# Patient Record
Sex: Female | Born: 1960 | Race: White | Hispanic: No | Marital: Single | Smoking: Current every day smoker
Health system: Southern US, Community
[De-identification: ages and names within clinical notes are randomized; demographics above are authoritative.]

## PROBLEM LIST (undated history)

## (undated) DIAGNOSIS — M1990 Unspecified osteoarthritis, unspecified site: Secondary | ICD-10-CM

## (undated) HISTORY — DX: Unspecified osteoarthritis, unspecified site: M19.90

## (undated) HISTORY — PX: OTHER SURGICAL HISTORY: SHX169

---
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
## 1984-05-16 HISTORY — PX: ABDOMINAL HYSTERECTOMY: SHX81

## 2013-09-13 DIAGNOSIS — Z90710 Acquired absence of both cervix and uterus: Secondary | ICD-10-CM | POA: Insufficient documentation

## 2015-12-31 DIAGNOSIS — M5136 Other intervertebral disc degeneration, lumbar region: Secondary | ICD-10-CM | POA: Diagnosis not present

## 2016-01-12 DIAGNOSIS — F17200 Nicotine dependence, unspecified, uncomplicated: Secondary | ICD-10-CM | POA: Diagnosis not present

## 2016-01-12 DIAGNOSIS — Z0000 Encounter for general adult medical examination without abnormal findings: Secondary | ICD-10-CM | POA: Diagnosis not present

## 2016-01-12 DIAGNOSIS — F17210 Nicotine dependence, cigarettes, uncomplicated: Secondary | ICD-10-CM | POA: Diagnosis not present

## 2016-01-12 DIAGNOSIS — G4700 Insomnia, unspecified: Secondary | ICD-10-CM | POA: Diagnosis not present

## 2017-02-21 NOTE — Progress Notes (Signed)
Sara [name] is a 56 y.o. female presents to office today to establish care and for annual physical exam examination.    Concerns today include: 1. Bumps in her chest Patient reports she noticed bumps on her chest about 2 months ago. She denies breast lumps or masses. No nipple discharge, no changes in the breast skin texture.    Occupation: Patent examiner part time at [employer], Substance use: tobacco use Diet: Balanced, Exercise: Physically active at work Last eye exam: 2018 Last dental exam: Greater than 6 months Last colonoscopy: Never. Patient declines cancer screening of all kinds. Last mammogram: Never. Patient declines breast cancer screening of all kinds. Last pap smear: History of total hysterectomy in the 80s.   Past Medical History:  Diagnosis Date  . Arthritis    Social History   Social History  . Marital status: Single    Spouse name: N/A  . Number of children: N/A  . Years of education: N/A   Occupational History  . Not on file.   Social History Main Topics  . Smoking status: Current Every Day Smoker    Packs/day: 1.00    Years: 15.00    Types: Cigarettes  . Smokeless tobacco: Never Used  . Alcohol use No  . Drug use: No  . Sexual activity: Yes    Birth control/ protection: None     Comment: monogomous relationship   Other Topics Concern  . Not on file   Social History Narrative  . No narrative on file   Past Surgical History:  Procedure Laterality Date  . ABDOMINAL HYSTERECTOMY  1986  . FRACTURE SURGERY  1979   fx collar bone   Family History  Problem Relation Age of Onset  . COPD Mother   . Heart disease Mother   . Dementia Mother   . Kidney disease Father   . Alzheimer's disease Father   . Prostate cancer Father   . Cancer Brother   . Colon cancer Brother 72  . Aneurysm Maternal Grandmother   . Dementia Maternal Grandfather   . Alzheimer's disease Paternal Grandmother    No current outpatient prescriptions on  file.   ROS: Review of Systems Constitutional: negative Eyes: positive for contacts/glasses Ears, nose, mouth, throat, and face: positive for nasal congestion Respiratory: positive for intermittent cough Cardiovascular: negative Gastrointestinal: negative Genitourinary:negative Integument/breast: bumps as above Hematologic/lymphatic: negative Musculoskeletal:negative Neurological: negative Behavioral/Psych: positive for some sadness regarding the loss of her mother. Endocrine: negative Allergic/Immunologic: allergies to dust    Physical exam BP (!) 125/59   Pulse 73   Temp (!) 97.3 F (36.3 C)   Wt 126 lb (57.2 kg)  General appearance: alert, cooperative, appears stated age and no distress Head: Normocephalic, without obvious abnormality, atraumatic Eyes: negative findings: lids and lashes normal, conjunctivae and sclerae normal, corneas clear and pupils equal, round, reactive to light and accomodation Ears: external ears normal bilaterally Nose: Nares normal. Septum midline. Mucosa normal. No drainage or sinus tenderness. Throat: lips, mucosa, and tongue normal; teeth and gums normal Neck: no adenopathy, no carotid bruit, no JVD, supple, symmetrical, trachea midline and thyroid not enlarged, symmetric, no tenderness/mass/nodules Back: symmetric, no curvature. ROM normal. No CVA tenderness. Lungs: Slightly prolonged expiratory phase. Otherwise clear to auscultation bilaterally. Normal work of breathing on room air. Heart: regular rate and rhythm, S1, S2 normal, no murmur, click, rub or gallop Abdomen: soft, non-tender; bowel sounds normal; no masses,  no organomegaly Extremities: extremities normal, atraumatic, no cyanosis or edema Pulses:  2+ and symmetric Skin: Skin color, texture, turgor normal. No rashes or lesions or Areas of lumps on chest the patient was concerned about her rib protrusions. No discrete masses palpated. Lymph nodes: Cervical, supraclavicular, and axillary  nodes normal. Neurologic: Grossly normal    Assessment/ Plan: Sara Larry. Coco here for annual physical exam. She is a pleasant 56 year old woman who is here to establish care. During today's evaluation, she did express much reluctance and distrust of medical field secondary to her experience with her mother, who passed away in 06-25-2015. She does not wish to proceed with any cancer screenings, including mammography and colon cancer screening. We did discuss various options of her noninvasive for colon cancer screening. She has capacity and does understand the repercussions of a possible undiagnosed cancer, including death and disability. She accepts these risks. She would however like to have blood labs obtained.  Of note, towards the end of our visit patient was much more relaxed and engaging.  1. Annual physical exam Patient reports a urinalysis for evaluation of pH. No urinary complaints today. - Urinalysis  2. Tobacco use disorder Action phase of smoking cessation.  Offered resources but patient wishes to wean independently.  3. History of hysterectomy Pap not needed.  4. Chronic fatigue Declines all cancer screenings.  Will obtain metabolic labs. - CMP14+EGFR - TSH - CBC  5. Screening for HIV without presence of risk factors Low risk.  No known exposure.  No h/o IV drug use. - HIV antibody (with reflex)  6. Encounter for hepatitis C screening test for low risk patient Low risk.  No known exposure.  No h/o IV drug use. - Hepatitis C antibody  7. Screening for lipid disorders - Lipid Panel  8. Encounter for vitamin deficiency screening - VITAMIN D 25 Hydroxy (Vit-D Deficiency, Fractures)  9. Non-seasonal allergic rhinitis due to other allergic trigger Recommended sinus rinses. Patient wishes to pursue holistic measures if possible. We did discuss consideration for Allegra or Claritin as these are nonsedating if sinus rinses are insufficient. Primary allergen is  dust.   Counseled on healthy lifestyle choices, including diet (rich in fruits, vegetables and lean meats and low in salt and simple carbohydrates) and exercise (at least 30 minutes of moderate physical activity daily).  Patient to follow up in 1 year for annual exam or sooner if needed.  Ashly M. Lajuana Ripple, DO

## 2017-02-22 ENCOUNTER — Encounter (INDEPENDENT_AMBULATORY_CARE_PROVIDER_SITE_OTHER): Payer: Self-pay

## 2017-02-22 ENCOUNTER — Ambulatory Visit (INDEPENDENT_AMBULATORY_CARE_PROVIDER_SITE_OTHER): Payer: BLUE CROSS/BLUE SHIELD | Admitting: Family Medicine

## 2017-02-22 ENCOUNTER — Encounter: Payer: Self-pay | Admitting: Family Medicine

## 2017-02-22 VITALS — BP 125/59 | HR 73 | Temp 97.3°F | Wt 126.0 lb

## 2017-02-22 DIAGNOSIS — Z0000 Encounter for general adult medical examination without abnormal findings: Secondary | ICD-10-CM | POA: Diagnosis not present

## 2017-02-22 DIAGNOSIS — Z90710 Acquired absence of both cervix and uterus: Secondary | ICD-10-CM

## 2017-02-22 DIAGNOSIS — R5382 Chronic fatigue, unspecified: Secondary | ICD-10-CM

## 2017-02-22 DIAGNOSIS — Z1321 Encounter for screening for nutritional disorder: Secondary | ICD-10-CM

## 2017-02-22 DIAGNOSIS — Z114 Encounter for screening for human immunodeficiency virus [HIV]: Secondary | ICD-10-CM | POA: Diagnosis not present

## 2017-02-22 DIAGNOSIS — J309 Allergic rhinitis, unspecified: Secondary | ICD-10-CM | POA: Insufficient documentation

## 2017-02-22 DIAGNOSIS — J3089 Other allergic rhinitis: Secondary | ICD-10-CM

## 2017-02-22 DIAGNOSIS — Z13220 Encounter for screening for lipoid disorders: Secondary | ICD-10-CM

## 2017-02-22 DIAGNOSIS — F17200 Nicotine dependence, unspecified, uncomplicated: Secondary | ICD-10-CM

## 2017-02-22 DIAGNOSIS — Z1159 Encounter for screening for other viral diseases: Secondary | ICD-10-CM

## 2017-02-22 LAB — URINALYSIS
Bilirubin, UA: NEGATIVE
Glucose, UA: NEGATIVE
Ketones, UA: NEGATIVE
Leukocytes, UA: NEGATIVE
Nitrite, UA: NEGATIVE
Protein, UA: NEGATIVE
RBC, UA: NEGATIVE
Specific Gravity, UA: 1.02 (ref 1.005–1.030)
Urobilinogen, Ur: 0.2 mg/dL (ref 0.2–1.0)
pH, UA: 7 (ref 5.0–7.5)

## 2017-02-22 NOTE — Patient Instructions (Signed)
It was a pleasure seeing you today, Sara [name].  We discussed consideration for colon cancer screening and breast cancer screening. You declined these today. Congratulations on starting to quit smoking. Please let me know if there is anything I can do to help you with your goal. I will contact you will the results of your labs.  If anything is abnormal, I will call you.   Please feel free to call our office if any questions or concerns arise.  Warm Regards, Lajoya Dombek M. Porcha Deblanc, DO  Health Maintenance, Female Adopting a healthy lifestyle and getting preventive care can go a long way to promote health and wellness. Talk with your health care provider about what schedule of regular examinations is right for you. This is a good chance for you to check in with your provider about disease prevention and staying healthy. In between checkups, there are plenty of things you can do on your own. Experts have done a lot of research about which lifestyle changes and preventive measures are most likely to keep you healthy. Ask your health care provider for more information. Weight and diet Eat a healthy diet  Be sure to include plenty of vegetables, fruits, low-fat dairy products, and lean protein.  Do not eat a lot of foods high in solid fats, added sugars, or salt.  Get regular exercise. This is one of the most important things you can do for your health. ? Most adults should exercise for at least 150 minutes each week. The exercise should increase your heart rate and make you sweat (moderate-intensity exercise). ? Most adults should also do strengthening exercises at least twice a week. This is in addition to the moderate-intensity exercise.  Maintain a healthy weight  Body mass index (BMI) is a measurement that can be used to identify possible weight problems. It estimates body fat based on height and weight. Your health care provider can help determine your BMI and help you achieve or maintain a healthy  weight.  For females 70 years of age and older: ? A BMI below 18.5 is considered underweight. ? A BMI of 18.5 to 24.9 is normal. ? A BMI of 25 to 29.9 is considered overweight. ? A BMI of 30 and above is considered obese.  Watch levels of cholesterol and blood lipids  You should start having your blood tested for lipids and cholesterol at 54 years of age, then have this test every 5 years.  You may need to have your cholesterol levels checked more often if: ? Your lipid or cholesterol levels are high. ? You are older than 54 years of age. ? You are at high risk for heart disease.  Cancer screening Lung Cancer  Lung cancer screening is recommended for adults 3-84 years old who are at high risk for lung cancer because of a history of smoking.  A yearly low-dose CT scan of the lungs is recommended for people who: ? Currently smoke. ? Have quit within the past 15 years. ? Have at least a 30-pack-year history of smoking. A pack year is smoking an average of one pack of cigarettes a day for 1 year.  Yearly screening should continue until it has been 15 years since you quit.  Yearly screening should stop if you develop a health problem that would prevent you from having lung cancer treatment.  Breast Cancer  Practice breast self-awareness. This means understanding how your breasts normally appear and feel.  It also means doing regular breast self-exams. Let your  health care provider know about any changes, no matter how small.  If you are in your 20s or 30s, you should have a clinical breast exam (CBE) by a health care provider every 1-3 years as part of a regular health exam.  If you are 55 or older, have a CBE every year. Also consider having a breast X-ray (mammogram) every year.  If you have a family history of breast cancer, talk to your health care provider about genetic screening.  If you are at high risk for breast cancer, talk to your health care provider about having an  MRI and a mammogram every year.  Breast cancer gene (BRCA) assessment is recommended for women who have family members with BRCA-related cancers. BRCA-related cancers include: ? Breast. ? Ovarian. ? Tubal. ? Peritoneal cancers.  Results of the assessment will determine the need for genetic counseling and BRCA1 and BRCA2 testing.  Cervical Cancer Your health care provider may recommend that you be screened regularly for cancer of the pelvic organs (ovaries, uterus, and vagina). This screening involves a pelvic examination, including checking for microscopic changes to the surface of your cervix (Pap test). You may be encouraged to have this screening done every 3 years, beginning at age 24.  For women ages 11-65, health care providers may recommend pelvic exams and Pap testing every 3 years, or they may recommend the Pap and pelvic exam, combined with testing for human papilloma virus (HPV), every 5 years. Some types of HPV increase your risk of cervical cancer. Testing for HPV may also be done on women of any age with unclear Pap test results.  Other health care providers may not recommend any screening for nonpregnant women who are considered low risk for pelvic cancer and who do not have symptoms. Ask your health care provider if a screening pelvic exam is right for you.  If you have had past treatment for cervical cancer or a condition that could lead to cancer, you need Pap tests and screening for cancer for at least 20 years after your treatment. If Pap tests have been discontinued, your risk factors (such as having a new sexual partner) need to be reassessed to determine if screening should resume. Some women have medical problems that increase the chance of getting cervical cancer. In these cases, your health care provider may recommend more frequent screening and Pap tests.  Colorectal Cancer  This type of cancer can be detected and often prevented.  Routine colorectal cancer screening  usually begins at 54 years of age and continues through 54 years of age.  Your health care provider may recommend screening at an earlier age if you have risk factors for colon cancer.  Your health care provider may also recommend using home test kits to check for hidden blood in the stool.  A small camera at the end of a tube can be used to examine your colon directly (sigmoidoscopy or colonoscopy). This is done to check for the earliest forms of colorectal cancer.  Routine screening usually begins at age 26.  Direct examination of the colon should be repeated every 5-10 years through 54 years of age. However, you may need to be screened more often if early forms of precancerous polyps or small growths are found.  Skin Cancer  Check your skin from head to toe regularly.  Tell your health care provider about any new moles or changes in moles, especially if there is a change in a mole's shape or color.  Also  tell your health care provider if you have a mole that is larger than the size of a pencil eraser.  Always use sunscreen. Apply sunscreen liberally and repeatedly throughout the day.  Protect yourself by wearing long sleeves, pants, a wide-brimmed hat, and sunglasses whenever you are outside.  Heart disease, diabetes, and high blood pressure  High blood pressure causes heart disease and increases the risk of stroke. High blood pressure is more likely to develop in: ? People who have blood pressure in the high end of the normal range (130-139/85-89 mm Hg). ? People who are overweight or obese. ? People who are African American.  If you are 83-39 years of age, have your blood pressure checked every 3-5 years. If you are 57 years of age or older, have your blood pressure checked every year. You should have your blood pressure measured twice-once when you are at a hospital or clinic, and once when you are not at a hospital or clinic. Record the average of the two measurements. To check  your blood pressure when you are not at a hospital or clinic, you can use: ? An automated blood pressure machine at a pharmacy. ? A home blood pressure monitor.  If you are between 6 years and 42 years old, ask your health care provider if you should take aspirin to prevent strokes.  Have regular diabetes screenings. This involves taking a blood sample to check your fasting blood sugar level. ? If you are at a normal weight and have a low risk for diabetes, have this test once every three years after 54 years of age. ? If you are overweight and have a high risk for diabetes, consider being tested at a younger age or more often. Preventing infection Hepatitis B  If you have a higher risk for hepatitis B, you should be screened for this virus. You are considered at high risk for hepatitis B if: ? You were born in a country where hepatitis B is common. Ask your health care provider which countries are considered high risk. ? Your parents were born in a high-risk country, and you have not been immunized against hepatitis B (hepatitis B vaccine). ? You have HIV or AIDS. ? You use needles to inject street drugs. ? You live with someone who has hepatitis B. ? You have had sex with someone who has hepatitis B. ? You get hemodialysis treatment. ? You take certain medicines for conditions, including cancer, organ transplantation, and autoimmune conditions.  Hepatitis C  Blood testing is recommended for: ? Everyone born from [birth year] through 1963. ? Anyone with known risk factors for hepatitis C.  Sexually transmitted infections (STIs)  You should be screened for sexually transmitted infections (STIs) including gonorrhea and chlamydia if: ? You are sexually active and are younger than 54 years of age. ? You are older than 54 years of age and your health care provider tells you that you are at risk for this type of infection. ? Your sexual activity has changed since you were last screened and you  are at an increased risk for chlamydia or gonorrhea. Ask your health care provider if you are at risk.  If you do not have HIV, but are at risk, it may be recommended that you take a prescription medicine daily to prevent HIV infection. This is called pre-exposure prophylaxis (PrEP). You are considered at risk if: ? You are sexually active and do not regularly use condoms or know the HIV status of your  partner(s). ? You take drugs by injection. ? You are sexually active with a partner who has HIV.  Talk with your health care provider about whether you are at high risk of being infected with HIV. If you choose to begin PrEP, you should first be tested for HIV. You should then be tested every 3 months for as long as you are taking PrEP. Pregnancy  If you are premenopausal and you may become pregnant, ask your health care provider about preconception counseling.  If you may become pregnant, take 400 to 800 micrograms (mcg) of folic acid every day.  If you want to prevent pregnancy, talk to your health care provider about birth control (contraception). Osteoporosis and menopause  Osteoporosis is a disease in which the bones lose minerals and strength with aging. This can result in serious bone fractures. Your risk for osteoporosis can be identified using a bone density scan.  If you are 25 years of age or older, or if you are at risk for osteoporosis and fractures, ask your health care provider if you should be screened.  Ask your health care provider whether you should take a calcium or vitamin D supplement to lower your risk for osteoporosis.  Menopause may have certain physical symptoms and risks.  Hormone replacement therapy may reduce some of these symptoms and risks. Talk to your health care provider about whether hormone replacement therapy is right for you. Follow these instructions at home:  Schedule regular health, dental, and eye exams.  Stay current with your  immunizations.  Do not use any tobacco products including cigarettes, chewing tobacco, or electronic cigarettes.  If you are pregnant, do not drink alcohol.  If you are breastfeeding, limit how much and how often you drink alcohol.  Limit alcohol intake to no more than 1 drink per day for nonpregnant women. One drink equals 12 ounces of beer, 5 ounces of wine, or 1 ounces of hard liquor.  Do not use street drugs.  Do not share needles.  Ask your health care provider for help if you need support or information about quitting drugs.  Tell your health care provider if you often feel depressed.  Tell your health care provider if you have ever been abused or do not feel safe at home. This information is not intended to replace advice given to you by your health care provider. Make sure you discuss any questions you have with your health care provider. Document Released: 11/15/2010 Document Revised: 10/08/2015 Document Reviewed: 02/03/2015 Elsevier Interactive Patient Education  [employer].

## 2017-02-23 LAB — CMP14+EGFR
ALT: 13 IU/L (ref 0–32)
AST: 14 IU/L (ref 0–40)
Albumin/Globulin Ratio: 1.9 (ref 1.2–2.2)
Albumin: 4.2 g/dL (ref 3.5–5.5)
Alkaline Phosphatase: 76 IU/L (ref 39–117)
BUN/Creatinine Ratio: 9 (ref 9–23)
BUN: 7 mg/dL (ref 6–24)
Bilirubin Total: 0.4 mg/dL (ref 0.0–1.2)
CO2: 26 mmol/L (ref 20–29)
Calcium: 9.3 mg/dL (ref 8.7–10.2)
Chloride: 106 mmol/L (ref 96–106)
Creatinine, Ser: 0.81 mg/dL (ref 0.57–1.00)
GFR calc Af Amer: 95 mL/min/{1.73_m2} (ref >59)
GFR calc non Af Amer: 83 mL/min/{1.73_m2} (ref >59)
Globulin, Total: 2.2 g/dL (ref 1.5–4.5)
Glucose: 78 mg/dL (ref 65–99)
Potassium: 4.5 mmol/L (ref 3.5–5.2)
Sodium: 144 mmol/L (ref 134–144)
Total Protein: 6.4 g/dL (ref 6.0–8.5)

## 2017-02-23 LAB — CBC
Hematocrit: 43.1 % (ref 34.0–46.6)
Hemoglobin: 14.5 g/dL (ref 11.1–15.9)
MCH: 33.2 pg — ABNORMAL HIGH (ref 26.6–33.0)
MCHC: 33.6 g/dL (ref 31.5–35.7)
MCV: 99 fL — ABNORMAL HIGH (ref 79–97)
Platelets: 243 10*3/uL (ref 150–379)
RBC: 4.37 x10E6/uL (ref 3.77–5.28)
RDW: 13 % (ref 12.3–15.4)
WBC: 6.1 10*3/uL (ref 3.4–10.8)

## 2017-02-23 LAB — TSH: TSH: 1.75 u[IU]/mL (ref 0.450–4.500)

## 2017-02-23 LAB — VITAMIN D 25 HYDROXY (VIT D DEFICIENCY, FRACTURES): Vit D, 25-Hydroxy: 33.7 ng/mL (ref 30.0–100.0)

## 2017-02-23 LAB — LIPID PANEL
Chol/HDL Ratio: 3.1 ratio (ref 0.0–4.4)
Cholesterol, Total: 183 mg/dL (ref 100–199)
HDL: 60 mg/dL (ref >39)
LDL Calculated: 108 mg/dL — ABNORMAL HIGH (ref 0–99)
Triglycerides: 76 mg/dL (ref 0–149)
VLDL Cholesterol Cal: 15 mg/dL (ref 5–40)

## 2017-02-23 LAB — HEPATITIS C ANTIBODY: Hep C Virus Ab: <0.1 s/co ratio (ref 0.0–0.9)

## 2017-02-23 LAB — HIV ANTIBODY (ROUTINE TESTING W REFLEX): HIV Screen 4th Generation wRfx: NONREACTIVE

## 2017-02-24 ENCOUNTER — Telehealth: Payer: Self-pay | Admitting: Family Medicine

## 2017-02-28 NOTE — Telephone Encounter (Signed)
Mailed out last labs.

## 2017-03-03 NOTE — Telephone Encounter (Signed)
Patient informed that copy of labs mailed to her

## 2017-04-17 ENCOUNTER — Other Ambulatory Visit: Payer: Self-pay | Admitting: Family Medicine

## 2017-04-17 ENCOUNTER — Telehealth: Payer: Self-pay | Admitting: Family Medicine

## 2017-04-17 DIAGNOSIS — N281 Cyst of kidney, acquired: Secondary | ICD-10-CM

## 2017-04-17 NOTE — Telephone Encounter (Signed)
Patient was seen by orthopedic due to a workers comp injury. While seen for that injury MRI was ordered which found 1 cyst on each kidney with one a pretty good size. She would like a referral to urologist and doesn't think this is related to workers comp injury so [employer] will be filed. She has her MRI on a cd.

## 2017-04-17 NOTE — Progress Notes (Signed)
Patient calls requesting a urology referral after she had renal cysts appreciated bilaterally incidentally on recent MRI of her back.  Referral has been placed.  She will bring the MRI result to that office visit.

## 2017-04-17 NOTE — Telephone Encounter (Signed)
Referral placed.

## 2017-04-24 ENCOUNTER — Ambulatory Visit: Payer: Self-pay | Admitting: Physical Therapy

## 2017-04-26 ENCOUNTER — Encounter: Payer: Self-pay | Admitting: Physical Therapy

## 2017-04-26 ENCOUNTER — Ambulatory Visit: Payer: Worker's Compensation | Attending: Orthopaedic Surgery | Admitting: Physical Therapy

## 2017-04-26 DIAGNOSIS — M545 Low back pain: Secondary | ICD-10-CM | POA: Insufficient documentation

## 2017-04-26 DIAGNOSIS — M25561 Pain in right knee: Secondary | ICD-10-CM | POA: Diagnosis present

## 2017-04-26 DIAGNOSIS — M25661 Stiffness of right knee, not elsewhere classified: Secondary | ICD-10-CM | POA: Diagnosis present

## 2017-04-26 NOTE — Therapy (Signed)
[name] [address] [place], [name], [address] Phone: [phone]   Fax:  [phone]  Physical Therapy Evaluation  Patient Details  Name: Sara [name] MRN: [id number] Date of Birth: [birth year] Referring Provider: [name]   Encounter Date: 04/26/2017  PT End of Session - 04/26/17 1454    Visit Number  1    Number of Visits  12    Date for PT Re-Evaluation  06/07/17    PT Start Time  1032    PT Stop Time  1133    PT Time Calculation (min)  61 min    Activity Tolerance  Patient tolerated treatment well    Behavior During Therapy  [facility] for tasks assessed/performed       Past Medical History:  Diagnosis Date  . Arthritis     Past Surgical History:  Procedure Laterality Date  . ABDOMINAL HYSTERECTOMY  1986  . FRACTURE SURGERY  1979   fx collar bone    There were no vitals filed for this visit.   Subjective Assessment - 04/26/17 1503    Subjective  The patient reports a work related accident on November 8th, 2018.  She fell coming down some steps and landed on her knees falling toward right onto elbow and essentially flipped to her back..  This was very traumatizing to the patient and she became emotional upon retelling the story.  She reports low back pain and right knee pain rated at 9-10/10 with activity such as riding in a car; household chores and general ADL's.  Hot Epsom salt baths decrease pain.    Pertinent History  Patient reports a low back injury many years ago but she was experiencing no pain.    Limitations  Standing;Sitting    Diagnostic tests  X-ray and MRI.    Patient Stated Goals  Get out of pain and back to work.    Currently in Pain?  Yes    Pain Score  9     Pain Location  Back    Pain Orientation  Right;Left;Upper;Mid;Lower    Pain Descriptors / Indicators  Aching;Spasm    Pain Type  Acute pain    Pain Onset  1 to 4 weeks ago    Pain Frequency  Constant    Aggravating Factors   See  above.    Pain Relieving Factors  See above.    Effect of Pain on Daily Activities  Cannot perform ADL's without pain.    Multiple Pain Sites  Yes    Pain Score  9    Pain Location  Knee    Pain Orientation  Right    Pain Descriptors / Indicators  Aching;Throbbing;Sharp    Pain Type  Acute pain    Pain Onset  More than a month ago    Pain Frequency  Constant    Aggravating Factors   Stand and stairs.    Pain Relieving Factors  Rest.         [facility] PT Assessment - 04/26/17 0001      Assessment   Medical Diagnosis  Low back pain and right knee pain.    Referring Provider  [name]    Onset Date/Surgical Date  -- March 23, 2017.      Precautions   Precautions  -- PAIN-FREE RT QUAD EXERCISE.    Required Braces or Orthoses  -- RT neoprene sleeve, recommended one with patellar orifice.      Restrictions   Weight  Bearing Restrictions  No      Balance Screen   Has the patient fallen in the past 6 months  Yes    How many times?  -- 1.    Has the patient had a decrease in activity level because of a fear of falling?   Yes    Is the patient reluctant to leave their home because of a fear of falling?   No      Home Environment   Living Environment  Private residence      Prior Function   Level of Independence  Independent      Posture/Postural Control   Posture/Postural Control  Postural limitations    Postural Limitations  Rounded Shoulders;Forward head;Decreased lumbar lordosis    Posture Comments  Right knee held in flexion.      ROM / Strength   AROM / PROM / Strength  AROM;Strength      AROM   Overall AROM Comments  Full lumbar flexion and extension.  Full right knee flexion and extension limited to -10 degrees.  Some crepitus noted during right knee AROM.      Strength   Overall Strength Comments  Right knee extension strength limited to 4-/5 likely due to pain.      Palpation   Palpation comment  Pain reported "around" right patella.  Patient currently  has diffuse pain reported from lower thoracic and over her entire lumbar paraspinal musculature and bilateral SIJ's.      Special Tests    Special Tests  Lumbar;Sacrolliac Tests;Leg LengthTest;Knee Special Tests Pat DTR's 2+/4+; Achilles DTR's= 1+/4+.    Lumbar Tests  -- (+) RT SLR.    Sacroiliac Tests   -- (-) FABER testing.    Leg length test   -- Equal leg lengths.    Knee Special tests   -- Normal RT knee stability.      Ambulation/Gait   Gait Comments  Patient walks in obvious pain and right knee held in flexion.             Objective measurements completed on examination: See above findings.      [facility] Adult PT Treatment/Exercise - 04/26/17 0001      Modalities   Modalities  Electrical Stimulation;Moist Heat      Moist Heat Therapy   Number Minutes Moist Heat  20 Minutes    Moist Heat Location  -- Low back and right knee.      Programme researcher, broadcasting/film/video Location  -- Lumbar region.    Electrical Stimulation Action  IFC    Electrical Stimulation Parameters  80-150 Hz x 20 minutes.    Electrical Stimulation Goals  Tone;Pain                  PT Long Term Goals - 04/26/17 1656      PT LONG TERM GOAL #1   Title  Independent with a HEP.    Time  6    Period  Weeks    Status  New      PT LONG TERM GOAL #2   Title  Full right knee extension.    Time  6    Period  Weeks    Status  New      PT LONG TERM GOAL #3   Title  Perform a reciprocating stair gait with pain not > 2/10.    Time  6    Period  Weeks    Status  New  PT LONG TERM GOAL #4   Title  Stand 20 minutes with pain not > 2-3/10.    Time  6    Period  Weeks    Status  New      PT LONG TERM GOAL #5   Title  Sit 30 minutes with pain not > 2-3/10.    Time  6    Period  Weeks    Status  New      PT LONG TERM GOAL #6   Title  Perform ADL's with pain not > 3/10.    Time  6    Period  Weeks    Status  New             Plan - 04/26/17 1618     Clinical Impression Statement  The patient presents to OPPT with a work releated fall resulting in a low back and right knee injury that occured on 03/23/17.  She presents with high pain-levels in both regions.  She has a lack of right tknee extension with crepitus with active movement and associated loss of strength due to pain.  She currently has diffuse spinal pain from her lower thoracic region to lumbar and SIJ regions with muscle guarding.  Her pain and deficits have dramatically impaired her functional mobility.  She is unable to return to work at this time due to her current impairment.  the patient will benefit from skilled physical therapy intervention.    History and Personal Factors relevant to plan of care:  Old low back injury.  No reported pain prior to injury at work.    Clinical Presentation  Evolving    Clinical Presentation due to:  Symptoms not improving significantly.    Clinical Decision Making  Moderate    Rehab Potential  Good    Clinical Impairments Affecting Rehab Potential  High pain-level.    PT Frequency  2x / week    PT Duration  6 weeks    PT Treatment/Interventions  ADLs/Self Care Home Management;Cryotherapy;Electrical Stimulation;Ultrasound;Moist Heat;Gait training;Stair training;Functional mobility training;Therapeutic activities;Therapeutic exercise;Patient/family education;Neuromuscular re-education;Manual techniques;Passive range of motion;Dry needling    PT Next Visit Plan  Pain-free level 1-2 stationary bike; stretching to achieve full right knee extension, then progess with pain-free quadriceps exercises (O and CKC);  STW/M to lower thoracic-lumbar spine.  Begin draw-in progress; SKTC; hip bridges and then progress as tolerated into core/back stabilization exercises.  HMP and electrical stimulation.    Consulted and Agree with Plan of Care  Patient       Patient will benefit from skilled therapeutic intervention in order to improve the following deficits and  impairments:  Decreased activity tolerance, Abnormal gait, Decreased range of motion, Increased muscle spasms, Postural dysfunction, Pain  Visit Diagnosis: Acute bilateral low back pain, with sciatica presence unspecified  Acute pain of right knee  Stiffness of right knee, not elsewhere classified     Problem List Patient Active Problem List   Diagnosis Date Noted  . Tobacco use disorder 02/22/2017  . History of hysterectomy 02/22/2017  . Allergic rhinitis due to allergen 02/22/2017  . Chronic fatigue 02/22/2017    Sara [name], ItalyHAD 04/26/2017, 4:59 PM  [name] [address] [place], [name], [address] Phone: [phone]   Fax:  [phone]  Name: Sara [name] MRN: [id number] Date of Birth: 04/03/1961

## 2017-04-28 ENCOUNTER — Encounter: Payer: Self-pay | Admitting: Physical Therapy

## 2017-04-28 ENCOUNTER — Ambulatory Visit: Payer: Worker's Compensation | Admitting: Physical Therapy

## 2017-04-28 DIAGNOSIS — M25561 Pain in right knee: Secondary | ICD-10-CM

## 2017-04-28 DIAGNOSIS — M25661 Stiffness of right knee, not elsewhere classified: Secondary | ICD-10-CM

## 2017-04-28 DIAGNOSIS — M545 Low back pain: Secondary | ICD-10-CM

## 2017-04-28 NOTE — Patient Instructions (Signed)
[employer] Arc Am[employer] IncQuad  Place a large can or rolled towel under leg. Straighten knee and leg. Hold _5-10 seconds. Repeat with other leg. Repeat __10-20_ times. Do _4-5_ sessions per day.   Bracing With Heel Slides (Supine)   With neutral spine, tighten pelvic floor and abdominals and hold. Alternating legs, slide heel to bottom. Repeat 10___ times. Do __4-5_ times a day.  Hamstrings    Lie on stomach with _0___ pound weight around left ankle. Bend same knee up to the pain but not into the pain pointing toes toward knee. Do not bend hips. Hold __2__ seconds. Repeat __10-20__ times. Do __3-4__ sessions per day. CAUTION: Move slowly.  Copyright  VHI. All rights reserved.      Solon PalmJulie Rhayne Chatwin, PT 04/28/17 10:43 AM  [name] [address] [place], [name], [address] Phone: [phone]   Fax:  [phone]

## 2017-04-28 NOTE — Therapy (Signed)
[name] [address] [place], [place], [address] Phone: [phone]   Fax:  [phone]  Physical Therapy Treatment  Patient Details  Name: Sara [name]. Abdulaziz MRN: [id number] Date of Birth: Nov 20, 1960 Referring Provider: Marcene Corning MD   Encounter Date: 04/28/2017  PT End of Session - 04/28/17 0956    Visit Number  2    Number of Visits  12    Date for PT Re-Evaluation  06/07/17    PT Start Time  0945    PT Stop Time  1053    PT Time Calculation (min)  68 min    Activity Tolerance  Patient tolerated treatment well    Behavior During Therapy  [facility] for tasks assessed/performed       Past Medical History:  Diagnosis Date  . Arthritis     Past Surgical History:  Procedure Laterality Date  . ABDOMINAL HYSTERECTOMY  1986  . FRACTURE SURGERY  1979   fx collar bone    There were no vitals filed for this visit.  Subjective Assessment - 04/28/17 0956    Subjective  Patient presents with same c/o R knee and mid thoracic back pain.    Pertinent History  Patient reports a low back injury many years ago but she was experiencing no pain.    Diagnostic tests  X-ray and MRI.    Patient Stated Goals  Get out of pain and back to work.    Currently in Pain?  Yes    Pain Score  8     Pain Location  Back    Pain Orientation  Right;Left;Upper;Mid;Lower    Pain Descriptors / Indicators  Aching    Pain Score  8    Pain Location  Knee    Pain Orientation  Right                      [facility] Adult PT Treatment/Exercise - 04/28/17 0001      Exercises   Exercises  Lumbar;Knee/Hip      Lumbar Exercises: Stretches   Standing Side Bend Limitations  "7" stretch at sink with side bend (not much stretch with side bend)    Quadruped Mid Back Stretch  30 seconds;2 reps;Limitations    Quadruped Mid Back Stretch Limitations  performed on stool with hands on plinth due to knee pain      Lumbar Exercises: Aerobic   Stationary Bike  L  1 x 8 min stopped due to pain increasing      Knee/Hip Exercises: Supine   Quad Sets  Strengthening;Right;2 sets;10 reps 5 sec with rolled towel under knee for cueing    Quad Sets Limitations  difficult to maintain contraction without towel    Heel Slides  AROM;Right;2 sets;10 reps    Straight Leg Raises Limitations  attempted but too painful      Knee/Hip Exercises: Prone   Hamstring Curl  20 reps    Hamstring Curl Limitations  pain free range with thigh on rolled towel      Modalities   Modalities  Electrical Stimulation;Moist Heat;Cryotherapy      Moist Heat Therapy   Number Minutes Moist Heat  15 Minutes    Moist Heat Location  Lumbar Spine      Cryotherapy   Number Minutes Cryotherapy  15 Minutes    Cryotherapy Location  Knee    Type of Cryotherapy  Ice pack      Electrical Stimulation   Electrical Stimulation Location  premod to T-spine and R knee    Electrical Stimulation Action  premod    Electrical Stimulation Parameters  80-150 Hz to Tspine; 1-10 Hz to R knee x 15 min    Electrical Stimulation Goals  Pain;Edema      Manual Therapy   Manual Therapy  Joint mobilization;Soft tissue mobilization    Joint Mobilization  gentle PA mobs to thoracic and lumbar spines central and unilateral    Soft tissue mobilization  to Bil lumbar and thoracic paraspinals             PT Education - 04/28/17 1246    Education provided  Yes    Education Details  HEP    Person(s) Educated  Patient    Methods  Explanation;Demonstration;Handout    Comprehension  Verbalized understanding;Returned demonstration          PT Long Term Goals - 04/26/17 1656      PT LONG TERM GOAL #1   Title  Independent with a HEP.    Time  6    Period  Weeks    Status  New      PT LONG TERM GOAL #2   Title  Full right knee extension.    Time  6    Period  Weeks    Status  New      PT LONG TERM GOAL #3   Title  Perform a reciprocating stair gait with pain not > 2/10.    Time  6     Period  Weeks    Status  New      PT LONG TERM GOAL #4   Title  Stand 20 minutes with pain not > 2-3/10.    Time  6    Period  Weeks    Status  New      PT LONG TERM GOAL #5   Title  Sit 30 minutes with pain not > 2-3/10.    Time  6    Period  Weeks    Status  New      PT LONG TERM GOAL #6   Title  Perform ADL's with pain not > 3/10.    Time  6    Period  Weeks    Status  New            Plan - 04/28/17 1247    Clinical Impression Statement  Patient did fairly well with therapy today. She was limited on the bike with pain in R knee at joint line but did well with manual therapy and strengthening/stretching.    Rehab Potential  Good    Clinical Impairments Affecting Rehab Potential  High pain-level.    PT Frequency  2x / week    PT Duration  6 weeks    PT Treatment/Interventions  ADLs/Self Care Home Management;Cryotherapy;Electrical Stimulation;Ultrasound;Moist Heat;Gait training;Stair training;Functional mobility training;Therapeutic activities;Therapeutic exercise;Patient/family education;Neuromuscular re-education;Manual techniques;Passive range of motion;Dry needling    PT Next Visit Plan  Pain-free level 1-2 stationary bike; stretching to achieve full right knee extension, then progess with pain-free quadriceps exercises (O and CKC);  STW/M to lower thoracic-lumbar spine.  Begin draw-in progress; SKTC; hip bridges and then progress as tolerated into core/back stabilization exercises.  HMP and electrical stimulation.    PT Home Exercise Plan  SAQ/quad set; heel slides, prone knee flex; childs pose stretch in chair       Patient will benefit from skilled therapeutic intervention in order to improve the following deficits and impairments:  Decreased  activity tolerance, Abnormal gait, Decreased range of motion, Increased muscle spasms, Postural dysfunction, Pain  Visit Diagnosis: Acute bilateral low back pain, with sciatica presence unspecified  Acute pain of right  knee  Stiffness of right knee, not elsewhere classified     Problem List Patient Active Problem List   Diagnosis Date Noted  . Tobacco use disorder 02/22/2017  . History of hysterectomy 02/22/2017  . Allergic rhinitis due to allergen 02/22/2017  . Chronic fatigue 02/22/2017    Solon PalmJulie Duke Weisensel PT 04/28/2017, 12:49 PM  [name] [address] [place], [name], [address] Phone: [phone]   Fax:  [phone]  Name: Belinda FisherLisa D. Mariann LasterBowden MRN: [id number] Date of Birth: [birth year]

## 2017-05-02 ENCOUNTER — Encounter: Payer: Self-pay | Admitting: Physical Therapy

## 2017-05-02 ENCOUNTER — Ambulatory Visit: Payer: Worker's Compensation | Admitting: Physical Therapy

## 2017-05-02 DIAGNOSIS — M545 Low back pain: Secondary | ICD-10-CM | POA: Diagnosis not present

## 2017-05-02 DIAGNOSIS — M25561 Pain in right knee: Secondary | ICD-10-CM

## 2017-05-02 DIAGNOSIS — M25661 Stiffness of right knee, not elsewhere classified: Secondary | ICD-10-CM

## 2017-05-02 NOTE — Therapy (Signed)
[name] [address] [place], [name], [address] Phone: [phone]   Fax:  [phone]  Physical Therapy Treatment  Patient Details  Name: Sara FisherLisa D. Mariann [name] MRN: [id number] Date of Birth: 1961-04-11 Referring Provider: [name]   Encounter Date: 05/02/2017  PT End of Session - 05/02/17 1128    Visit Number  3    Number of Visits  12    Date for PT Re-Evaluation  06/07/17    PT Start Time  1118    PT Stop Time  1209    PT Time Calculation (min)  51 min    Activity Tolerance  Patient tolerated treatment well    Behavior During Therapy  [facility] assessed/performed       Past Medical History:  Diagnosis Date  . Arthritis     Past Surgical History:  Procedure Laterality Date  . ABDOMINAL HYSTERECTOMY  1986  . FRACTURE SURGERY  1979   fx collar bone    There were no vitals filed for this visit.  Subjective Assessment - 05/02/17 1119    Subjective  Reports that her R knee gave way with her 4 times Saturday and reports that back is still hurting as well. Reports that she sees back dr 05/12/17 and knee dr tomorow.    Pertinent History  Patient reports a low back injury many years ago but she was experiencing no pain.    Limitations  Standing;Sitting    Diagnostic tests  X-ray and MRI.    Patient Stated Goals  Get out of pain and back to work.    Currently in Pain?  Yes    Pain Score  9     Pain Location  Knee    Pain Orientation  Right    Pain Descriptors / Indicators  Sore;Discomfort    Pain Type  Acute pain    Pain Onset  1 to 4 weeks ago         [facility] - 05/02/17 0001      Assessment   Medical Diagnosis  Low back pain and right knee pain.    Onset Date/Surgical Date  03/23/17    Next MD Visit  05/03/2017                  [facility]/Ex[facility] - 05/02/17 0001      Lumbar Exercises: Aerobic   Stationary Bike  L4 x569min      Lumbar Exercises: Standing   Other  Standing Lumbar Exercises  Standing cat/camel at plinth x10 reps      Lumbar Exercises: Supine   Ab Set  10 reps;5 seconds    Glut Set  10 reps;5 seconds      Knee/Hip Exercises: Supine   Heel Slides  AROM;Right;2 sets;10 reps with core activation    Straight Leg Raises  AROM;Right;10 reps pain in R quad per patient      Modalities   Modalities  Electrical Stimulation;Cryotherapy;Moist Heat      Moist Heat Therapy   Number Minutes Moist Heat  15 Minutes    Moist Heat Location  Lumbar Spine      Cryotherapy   Number Minutes Cryotherapy  15 Minutes    Cryotherapy Location  Knee    Type of Cryotherapy  Ice pack      Electrical Stimulation   Electrical Stimulation Location  Mid back, R knee    Electrical Stimulation Action  Pre-Mod    Electrical Stimulation Parameters  80-150 hz x15 min    Electrical Stimulation Goals  Pain;Edema                  PT Long Term Goals - 04/26/17 1656      PT LONG TERM GOAL #1   Title  Independent with a HEP.    Time  6    Period  Weeks    Status  New      PT LONG TERM GOAL #2   Title  Full right knee extension.    Time  6    Period  Weeks    Status  New      PT LONG TERM GOAL #3   Title  Perform a reciprocating stair gait with pain not > 2/10.    Time  6    Period  Weeks    Status  New      PT LONG TERM GOAL #4   Title  Stand 20 minutes with pain not > 2-3/10.    Time  6    Period  Weeks    Status  New      PT LONG TERM GOAL #5   Title  Sit 30 minutes with pain not > 2-3/10.    Time  6    Period  Weeks    Status  New      PT LONG TERM GOAL #6   Title  Perform ADL's with pain not > 3/10.    Time  6    Period  Weeks    Status  New            Plan - 05/02/17 1207    Clinical Impression Statement  Patient still experiencing increased R knee pain with inflammation noted along medial R knee upon observation. Patient donned compression sleeve to R knee throughout treatment. Patient guided through core  strengthening with incorporation of knee exercises with patient reporting cramp like sensation in R Quad as well as discomfort in R quad. Normal modalities response noted following removal of the modalities. Goals remain on-going secondary to pain.    Rehab Potential  Good    Clinical Impairments Affecting Rehab Potential  High pain-level.    PT Frequency  2x / week    PT Duration  6 weeks    PT Treatment/Interventions  ADLs/Self Care Home Management;Cryotherapy;Electrical Stimulation;Ultrasound;Moist Heat;Gait training;Stair training;Functional mobility training;Therapeutic activities;Therapeutic exercise;Patient/family education;Neuromuscular re-education;Manual techniques;Passive range of motion;Dry needling    PT Next Visit Plan  Pain-free level 1-2 stationary bike; stretching to achieve full right knee extension, then progess with pain-free quadriceps exercises (O and CKC);  STW/M to lower thoracic-lumbar spine.  Begin draw-in progress; SKTC; hip bridges and then progress as tolerated into core/back stabilization exercises.  HMP and electrical stimulation.    PT Home Exercise Plan  SAQ/quad set; heel slides, prone knee flex; childs pose stretch in chair    Consulted and Agree with Plan of Care  Patient       Patient will benefit from skilled therapeutic intervention in order to improve the following deficits and impairments:  Decreased activity tolerance, Abnormal gait, Decreased range of motion, Increased muscle spasms, Postural dysfunction, Pain  Visit Diagnosis: Acute bilateral low back pain, with sciatica presence unspecified  Acute pain of right knee  Stiffness of right knee, not elsewhere classified     Problem List Patient Active Problem List   Diagnosis Date Noted  . Tobacco use disorder 02/22/2017  . History of hysterectomy 02/22/2017  . Allergic  rhinitis due to allergen 02/22/2017  . Chronic fatigue 02/22/2017    Marvell FullerKelsey P Cyrena Kuchenbecker, PTA 05/02/17 12:14 PM   [name] [address] [place], [name], [address] Phone: [phone]   Fax:  [phone]  Name: Sara FisherLisa D. Mariann [name] MRN: [id number] Date of Birth: [birth year]

## 2017-05-04 ENCOUNTER — Ambulatory Visit: Payer: Worker's Compensation | Admitting: Physical Therapy

## 2017-05-04 ENCOUNTER — Encounter: Payer: Self-pay | Admitting: Physical Therapy

## 2017-05-04 DIAGNOSIS — M545 Low back pain: Secondary | ICD-10-CM | POA: Diagnosis not present

## 2017-05-04 DIAGNOSIS — M25661 Stiffness of right knee, not elsewhere classified: Secondary | ICD-10-CM

## 2017-05-04 DIAGNOSIS — M25561 Pain in right knee: Secondary | ICD-10-CM

## 2017-05-04 NOTE — Therapy (Signed)
[name] [address] [place], [name], [address] Phone: [phone]   Fax:  [phone]  Physical Therapy Treatment  Patient Details  Name: Sara FisherLisa D. Mariann LasterBowden MRN: [id number] Date of Birth: 1961-02-26 Referring Provider: [name]   Encounter Date: 05/04/2017  PT End of Session - 05/04/17 1148    Visit Number  4    Number of Visits  12    Date for PT Re-Evaluation  06/07/17    PT Start Time  1115    PT Stop Time  1159    PT Time Calculation (min)  44 min    Activity Tolerance  Patient tolerated treatment well    Behavior During Therapy  [facility] for tasks assessed/performed       Past Medical History:  Diagnosis Date  . Arthritis     Past Surgical History:  Procedure Laterality Date  . ABDOMINAL HYSTERECTOMY  1986  . FRACTURE SURGERY  1979   fx collar bone    There were no vitals filed for this visit.  Subjective Assessment - 05/04/17 1122    Subjective  Increased pain upon arrival for unknown reason    Pertinent History  Patient reports a low back injury many years ago but she was experiencing no pain.    Limitations  Standing;Sitting    Diagnostic tests  X-ray and MRI.    Patient Stated Goals  Get out of pain and back to work.    Currently in Pain?  Yes    Pain Score  9     Pain Location  Knee    Pain Orientation  Right    Pain Descriptors / Indicators  Discomfort    Pain Type  Acute pain    Pain Onset  More than a month ago    Aggravating Factors   increased activity    Pain Relieving Factors  rest    Pain Score  10    Pain Location  Back    Pain Orientation  Mid    Pain Descriptors / Indicators  Aching;Discomfort    Pain Onset  More than a month ago    Aggravating Factors   prolong activity    Pain Relieving Factors  rest                      [facility] Adult PT Treatment/Exercise - 05/04/17 0001      Lumbar Exercises: Aerobic   Stationary Bike  nustep L3 x545min unable       Moist Heat  Therapy   Number Minutes Moist Heat  15 Minutes    Moist Heat Location  Lumbar Spine      Cryotherapy   Number Minutes Cryotherapy  15 Minutes    Cryotherapy Location  Knee    Type of Cryotherapy  Ice pack      Electrical Stimulation   Electrical Stimulation Location  Mid back, R knee    Electrical Stimulation Action  premod    Electrical Stimulation Parameters  80-150hz  x6015min    Electrical Stimulation Goals  Pain;Edema      Manual Therapy   Manual Therapy  Myofascial release;Soft tissue mobilization    Soft tissue mobilization  manual STW to bil thoracic and scapular border to reduce tightness and pain                  PT Long Term Goals - 04/26/17 1656      PT LONG TERM GOAL #1   Title  Independent with a HEP.    Time  6    Period  Weeks    Status  New      PT LONG TERM GOAL #2   Title  Full right knee extension.    Time  6    Period  Weeks    Status  New      PT LONG TERM GOAL #3   Title  Perform a reciprocating stair gait with pain not > 2/10.    Time  6    Period  Weeks    Status  New      PT LONG TERM GOAL #4   Title  Stand 20 minutes with pain not > 2-3/10.    Time  6    Period  Weeks    Status  New      PT LONG TERM GOAL #5   Title  Sit 30 minutes with pain not > 2-3/10.    Time  6    Period  Weeks    Status  New      PT LONG TERM GOAL #6   Title  Perform ADL's with pain not > 3/10.    Time  6    Period  Weeks    Status  New            Plan - 05/04/17 1151    Clinical Impression Statement  Patient arrived with a lot of pain for unknown reason. Patient requested focus on back today. Patient unable to perform any exercises today. Patient had increased tightness in bil thoracic area today and palpable pain in bil scapular boarder esp right side. Goals ongoing.    Rehab Potential  Good    Clinical Impairments Affecting Rehab Potential  High pain-level.    PT Frequency  2x / week    PT Duration  6 weeks    PT  Treatment/Interventions  ADLs/Self Care Home Management;Cryotherapy;Electrical Stimulation;Ultrasound;Moist Heat;Gait training;Stair training;Functional mobility training;Therapeutic activities;Therapeutic exercise;Patient/family education;Neuromuscular re-education;Manual techniques;Passive range of motion;Dry needling    PT Next Visit Plan  Pain-free level 1-2 stationary bike; stretching to achieve full right knee extension, then progess with pain-free quadriceps exercises (O and CKC);  STW/M to lower thoracic-lumbar spine.  Begin draw-in progress; SKTC; hip bridges and then progress as tolerated into core/back stabilization exercises.  HMP and electrical stimulation.    Consulted and Agree with Plan of Care  Patient       Patient will benefit from skilled therapeutic intervention in order to improve the following deficits and impairments:  Decreased activity tolerance, Abnormal gait, Decreased range of motion, Increased muscle spasms, Postural dysfunction, Pain  Visit Diagnosis: Acute bilateral low back pain, with sciatica presence unspecified  Acute pain of right knee  Stiffness of right knee, not elsewhere classified     Problem List Patient Active Problem List   Diagnosis Date Noted  . Tobacco use disorder 02/22/2017  . History of hysterectomy 02/22/2017  . Allergic rhinitis due to allergen 02/22/2017  . Chronic fatigue 02/22/2017    [name], PTA 05/04/2017, 12:00 PM  [name] [address] [place], [name], [address] Phone: [phone]   Fax:  [phone]  Name: Sara FisherLisa D. Mariann LasterBowden MRN: [id number] Date of Birth: 11-Nov-1960

## 2017-05-05 DIAGNOSIS — F4323 Adjustment disorder with mixed anxiety and depressed mood: Secondary | ICD-10-CM | POA: Diagnosis not present

## 2017-05-06 DIAGNOSIS — F4323 Adjustment disorder with mixed anxiety and depressed mood: Secondary | ICD-10-CM | POA: Insufficient documentation

## 2017-05-11 ENCOUNTER — Encounter: Payer: Self-pay | Admitting: Physical Therapy

## 2017-05-11 ENCOUNTER — Ambulatory Visit: Payer: Worker's Compensation | Admitting: Physical Therapy

## 2017-05-11 DIAGNOSIS — M545 Low back pain: Secondary | ICD-10-CM | POA: Diagnosis not present

## 2017-05-11 DIAGNOSIS — M25661 Stiffness of right knee, not elsewhere classified: Secondary | ICD-10-CM

## 2017-05-11 DIAGNOSIS — M25561 Pain in right knee: Secondary | ICD-10-CM

## 2017-05-11 NOTE — Patient Instructions (Signed)
Pelvic Tilt: Posterior - Legs Bent (Supine)   Tighten stomach and flatten back by rolling pelvis down. Hold _10___ seconds. Relax. Repeat _10-30___ times per set. Do __2__ sets per session. Do _2___ sessions per day.   Bent Leg Lift (Hook-Lying)   Tighten stomach and slowly raise right leg _5___ inches from floor. Keep trunk rigid. Hold _3___ seconds. Repeat _10___ times per set. Do ___2-3_ sets per session. Do __2__ sessions per day.   Stretch Break - Chest and Shoulder Stretch   Maintaining erect posture, draw shoulders back while bringing elbows back and inward. Return to starting position. Repeat __10-20__ times every _3-4___ hours.    Brushing Teeth    Place one foot on ledge and one hand on counter. Bend other knee slightly to keep back straight.  Copyright  VHI. All rights reserved.  Refrigerator   Squat with knees apart to reach lower shelves and drawers.   Copyright  VHI. All rights reserved.  Laundry [employer]   Squat down and hold basket close to stand. Use leg muscles to do the work.   Copyright  VHI. All rights reserved.  Housework - Vacuuming   Hold the vacuum with arm held at side. Step back and forth to move it, keeping head up. Avoid twisting.   Copyright  VHI. All rights reserved.  Housework - Wiping   Position yourself as close as possible to reach work surface. Avoid straining your back.   Copyright  VHI. All rights reserved.  Gardening - Mowing   Keep arms close to sides and walk with lawn mower.   Copyright  VHI. All rights reserved.  Sleeping on Side   Place pillow between knees. Use cervical support under neck and a roll around waist as needed.   Copyright  VHI. All rights reserved.  Log Roll   Lying on back, bend left knee and place left arm across chest. Roll all in one movement to the right. Reverse to roll to the left. Always move as one unit.   Copyright  VHI. All rights reserved.  Stand to Sit / Sit to  Stand   To sit: Bend knees to lower self onto front edge of chair, then scoot back on seat. To stand: Reverse sequence by placing one foot forward, and scoot to front of seat. Use rocking motion to stand up.  Copyright  VHI. All rights reserved.  Posture - Standing   Good posture is important. Avoid slouching and forward head thrust. Maintain curve in low back and align ears over shoul- ders, hips over ankles.   Copyright  VHI. All rights reserved.  Posture - Sitting   Sit upright, head facing forward. Try using a roll to support lower back. Keep shoulders relaxed, and avoid rounded back. Keep hips level with knees. Avoid crossing legs for long periods.   Copyright  VHI. All rights reserved.  Computer Work   Position work to Art gallery managerface forward. Use proper work and seat height. Keep shoulders back and down, wrists straight, and elbows at right angles. Use chair that provides full back support. Add footrest and lumbar roll as needed.   Copyright  VHI. All rights reserved.

## 2017-05-11 NOTE — Therapy (Signed)
[name] [address] [place], [name], [address] Phone: [phone]   Fax:  [phone]  Physical Therapy Treatment  Patient Details  Name: Sara FisherLisa D. Mariann [name] MRN: [id number] Date of Birth: 13-Jul-1960 Referring Provider: [name]   Encounter Date: 05/11/2017  PT End of Session - 05/11/17 1118    Visit Number  5    Number of Visits  12    Date for PT Re-Evaluation  06/07/17    PT Start Time  1031    PT Stop Time  1127    PT Time Calculation (min)  56 min    Activity Tolerance  Patient tolerated treatment well    Behavior During Therapy  [facility] for tasks assessed/performed       Past Medical History:  Diagnosis Date  . Arthritis     Past Surgical History:  Procedure Laterality Date  . ABDOMINAL HYSTERECTOMY  1986  . FRACTURE SURGERY  1979   fx collar bone    There were no vitals filed for this visit.  Subjective Assessment - 05/11/17 1040    Subjective  Patient reported 2 days relief after last treatment    Pertinent History  Patient reports a low back injury many years ago but she was experiencing no pain.    Limitations  Standing;Sitting    Diagnostic tests  X-ray and MRI.    Patient Stated Goals  Get out of pain and back to work.    Currently in Pain?  Yes    Pain Score  8     Pain Location  Knee    Pain Orientation  Right    Pain Descriptors / Indicators  Discomfort    Pain Type  Acute pain    Pain Onset  More than a month ago    Pain Frequency  Constant    Aggravating Factors   increased activity    Pain Relieving Factors  rest    Pain Score  10    Pain Location  Back    Pain Orientation  Mid    Pain Descriptors / Indicators  Discomfort;Aching    Pain Onset  More than a month ago    Pain Frequency  Constant    Aggravating Factors   prolong activity    Pain Relieving Factors  rest                      [facility] Adult PT Treatment/Exercise - 05/11/17 0001      Self-Care   Self-Care   ADL's;Lifting;Posture;Other Self-Care Comments    Other Self-Care Comments   HEP provided for all above      Lumbar Exercises: Seated   Other Seated Lumbar Exercises  seated scap retraction x5      Lumbar Exercises: Supine   Ab Set  20 reps 10sec    Glut Set  20 reps 10sec    Bent Knee Raise  3 seconds 2x10      Moist Heat Therapy   Number Minutes Moist Heat  15 Minutes    Moist Heat Location  Lumbar Spine      Cryotherapy   Number Minutes Cryotherapy  15 Minutes    Cryotherapy Location  Knee    Type of Cryotherapy  Ice pack      Electrical Stimulation   Electrical Stimulation Location  Mid back, R knee    Electrical Stimulation Action  premod    Electrical Stimulation Parameters  80-150hz  x8315min    Electrical  Stimulation Goals  Pain;Edema      Manual Therapy   Manual Therapy  Myofascial release;Soft tissue mobilization    Soft tissue mobilization  manual STW and triger point release to bil thoracic and scapular border to reduce tightness and pain             PT Education - 05/11/17 1116    Education Details  HEP posture/core/techniques lift, bend, position and ADL's    Person(s) Educated  Patient    Methods  Explanation;Demonstration;Handout    Comprehension  Verbalized understanding;Returned demonstration          PT Long Term Goals - 05/11/17 1119      PT LONG TERM GOAL #1   Title  Independent with a HEP.    Time  6    Period  Weeks    Status  Achieved      PT LONG TERM GOAL #2   Title  Full right knee extension.    Time  6    Period  Weeks    Status  On-going      PT LONG TERM GOAL #3   Title  Perform a reciprocating stair gait with pain not > 2/10.    Time  6    Period  Weeks    Status  On-going      PT LONG TERM GOAL #4   Title  Stand 20 minutes with pain not > 2-3/10.    Time  6    Period  Weeks    Status  On-going      PT LONG TERM GOAL #5   Title  Sit 30 minutes with pain not > 2-3/10.    Time  6    Period  Weeks    Status   On-going      PT LONG TERM GOAL #6   Title  Perform ADL's with pain not > 3/10.    Time  6    Period  Weeks    Status  On-going            Plan - 05/11/17 1120    Clinical Impression Statement  Patient arrived with reported a few days relief after last treatment. Patient reported pain returned yet feels like theray has helped. Patient pain level too high to tolerate exercises for knee and back today. Today educated patient on posture awareness techniques and posture/core activation to help gently strengthen and stabilize back. Patient issued HEP with understanding. Goals ongoing at this time. MD note sent to Dr. Yevette Edwards    Rehab Potential  Good    Clinical Impairments Affecting Rehab Potential  High pain-level.    PT Frequency  2x / week    PT Duration  6 weeks    PT Treatment/Interventions  ADLs/Self Care Home Management;Cryotherapy;Electrical Stimulation;Ultrasound;Moist Heat;Gait training;Stair training;Functional mobility training;Therapeutic activities;Therapeutic exercise;Patient/family education;Neuromuscular re-education;Manual techniques;Passive range of motion;Dry needling    PT Next Visit Plan  Pain-free level 1-2 stationary bike; stretching to achieve full right knee extension, then progess with pain-free quadriceps exercises (O and CKC);  STW/M to lower thoracic-lumbar spine.  Begin draw-in progress; SKTC; hip bridges and then progress as tolerated into core/back stabilization exercises.  HMP and electrical stimulation.    Consulted and Agree with Plan of Care  Patient       Patient will benefit from skilled therapeutic intervention in order to improve the following deficits and impairments:  Decreased activity tolerance, Abnormal gait, Decreased range of motion, Increased muscle spasms, Postural dysfunction, Pain  Visit Diagnosis: Acute bilateral low back pain, with sciatica presence unspecified  Acute pain of right knee  Stiffness of right knee, not elsewhere  classified     Problem List Patient Active Problem List   Diagnosis Date Noted  . Tobacco use disorder 02/22/2017  . History of hysterectomy 02/22/2017  . Allergic rhinitis due to allergen 02/22/2017  . Chronic fatigue 02/22/2017    Cathie HoopsChristina Brei Pociask, PTA 05/11/17 11:31 AM  [name] [address] [place], [name], [address] Phone: [phone]   Fax:  [phone]  Name: Sara FisherLisa D. Mariann [name] MRN: [id number] Date of Birth: Aug 26, 1960

## 2017-05-17 ENCOUNTER — Ambulatory Visit: Payer: Worker's Compensation | Attending: Orthopaedic Surgery | Admitting: Physical Therapy

## 2017-05-17 ENCOUNTER — Encounter: Payer: Self-pay | Admitting: Physical Therapy

## 2017-05-17 DIAGNOSIS — M545 Low back pain: Secondary | ICD-10-CM | POA: Insufficient documentation

## 2017-05-17 DIAGNOSIS — M25561 Pain in right knee: Secondary | ICD-10-CM | POA: Diagnosis present

## 2017-05-17 DIAGNOSIS — M25661 Stiffness of right knee, not elsewhere classified: Secondary | ICD-10-CM | POA: Diagnosis present

## 2017-05-17 NOTE — Therapy (Signed)
[name] [address] [place], [name], [address] Phone: [phone]   Fax:  [phone]  Physical Therapy Treatment  Patient Details  Name: Sara FisherLisa D. Mariann [name] MRN: [id number] Date of Birth: Oct 26, 1960 Referring Provider: [name]   Encounter Date: 05/17/2017  PT End of Session - 05/17/17 1149    Visit Number  6    Number of Visits  12    Date for PT Re-Evaluation  06/07/17    PT Start Time  1115    PT Stop Time  1203    PT Time Calculation (min)  48 min    Activity Tolerance  Patient tolerated treatment well    Behavior During Therapy  [facility] for tasks assessed/performed       Past Medical History:  Diagnosis Date  . Arthritis     Past Surgical History:  Procedure Laterality Date  . ABDOMINAL HYSTERECTOMY  1986  . FRACTURE SURGERY  1979   fx collar bone    There were no vitals filed for this visit.  Subjective Assessment - 05/17/17 1117    Subjective  Patient reported went to MD and is to continue theapy increasing frequency and return to work    Pertinent History  Patient reports a low back injury many years ago but she was experiencing no pain.    Limitations  Standing;Sitting    Diagnostic tests  X-ray and MRI.    Patient Stated Goals  Get out of pain and back to work.    Currently in Pain?  Yes    Pain Score  8     Pain Location  Knee    Pain Orientation  Right    Pain Descriptors / Indicators  Discomfort    Pain Type  Acute pain    Pain Onset  More than a month ago    Pain Frequency  Constant    Aggravating Factors   increased activity    Pain Relieving Factors  rest    Pain Score  10    Pain Location  Back    Pain Orientation  Mid    Pain Descriptors / Indicators  Discomfort    Pain Type  Acute pain    Pain Onset  More than a month ago    Pain Frequency  Constant    Aggravating Factors   prolong activity    Pain Relieving Factors  rest                      [facility] Adult PT  Treatment/Exercise - 05/17/17 0001      Moist Heat Therapy   Number Minutes Moist Heat  15 Minutes    Moist Heat Location  Lumbar Spine      Cryotherapy   Number Minutes Cryotherapy  15 Minutes    Cryotherapy Location  Knee right    Type of Cryotherapy  Ice pack      Electrical Stimulation   Electrical Stimulation Location  mid back    Electrical Stimulation Action  IFC    Electrical Stimulation Parameters  80-150hz  x4015min    Electrical Stimulation Goals  Pain;Edema      Manual Therapy   Manual Therapy  Myofascial release;Soft tissue mobilization    Soft tissue mobilization  manual STW to mid and low back and triger point release to bil thoracic and scapular border to reduce tightness and pain  PT Long Term Goals - 05/11/17 1119      PT LONG TERM GOAL #1   Title  Independent with a HEP.    Time  6    Period  Weeks    Status  Achieved      PT LONG TERM GOAL #2   Title  Full right knee extension.    Time  6    Period  Weeks    Status  On-going      PT LONG TERM GOAL #3   Title  Perform a reciprocating stair gait with pain not > 2/10.    Time  6    Period  Weeks    Status  On-going      PT LONG TERM GOAL #4   Title  Stand 20 minutes with pain not > 2-3/10.    Time  6    Period  Weeks    Status  On-going      PT LONG TERM GOAL #5   Title  Sit 30 minutes with pain not > 2-3/10.    Time  6    Period  Weeks    Status  On-going      PT LONG TERM GOAL #6   Title  Perform ADL's with pain not > 3/10.    Time  6    Period  Weeks    Status  On-going            Plan - 05/17/17 1151    Clinical Impression Statement  Today patient tolerated treatment well. Patient requested to hold on exercises today and try next visit. Patient wanted to focus on back today to decrease pain in low and mid back. Patient had decreased tightness and decreased palpable pain. Educated patient on starting gentle stretching and strengthening next treatment for  right knee and mid/low back. Current goals ongoing due to pain defcits.     Rehab Potential  Good    Clinical Impairments Affecting Rehab Potential  High pain-level.    PT Frequency  2x / week    PT Duration  6 weeks    PT Treatment/Interventions  ADLs/Self Care Home Management;Cryotherapy;Electrical Stimulation;Ultrasound;Moist Heat;Gait training;Stair training;Functional mobility training;Therapeutic activities;Therapeutic exercise;Patient/family education;Neuromuscular re-education;Manual techniques;Passive range of motion;Dry needling    PT Next Visit Plan  cont with POC for nustep for LE and core activation/gentle knee stretching to achieve full right knee extension/ progess with pain-free quadriceps exercises (O and CKC);  mid/low back stabilization exercises/STW/M to lower thoracic-lumbar spine/ HMP and electrical stimulation.    Consulted and Agree with Plan of Care  Patient       Patient will benefit from skilled therapeutic intervention in order to improve the following deficits and impairments:  Decreased activity tolerance, Abnormal gait, Decreased range of motion, Increased muscle spasms, Postural dysfunction, Pain  Visit Diagnosis: Acute bilateral low back pain, with sciatica presence unspecified  Acute pain of right knee  Stiffness of right knee, not elsewhere classified     Problem List Patient Active Problem List   Diagnosis Date Noted  . Tobacco use disorder 02/22/2017  . History of hysterectomy 02/22/2017  . Allergic rhinitis due to allergen 02/22/2017  . Chronic fatigue 02/22/2017    Sara [name] P, PTA 05/17/2017, 12:05 PM  [facility] [address] [place], [place], [address] Phone: [phone]   Fax:  [phone]  Name: Sara [name] MRN: [id number] Date of Birth: 1961/03/24

## 2017-05-17 NOTE — Therapy (Signed)
[name] [address] [place], [name], [address] Phone: [phone]   Fax:  [phone]  Physical Therapy Treatment  Patient Details  Name: Sara [name] MRN: [id number] Date of Birth: [birth year] Referring Provider: [name]   Encounter Date: 05/17/2017  PT End of Session - 05/17/17 1149    Visit Number  6    Number of Visits  24    Date for PT Re-Evaluation  06/23/17    PT Start Time  1115    PT Stop Time  1203    PT Time Calculation (min)  48 min    Activity Tolerance  Patient tolerated treatment well    Behavior During Therapy  [facility] for tasks assessed/performed       Past Medical History:  Diagnosis Date  . Arthritis     Past Surgical History:  Procedure Laterality Date  . ABDOMINAL HYSTERECTOMY  1986  . FRACTURE SURGERY  1979   fx collar bone    There were no vitals filed for this visit.  Subjective Assessment - 05/17/17 1117    Subjective  Patient reported went to MD and is to continue theapy increasing frequency and return to work    Pertinent History  Patient reports a low back injury many years ago but she was experiencing no pain.    Limitations  Standing;Sitting    Diagnostic tests  X-ray and MRI.    Patient Stated Goals  Get out of pain and back to work.    Currently in Pain?  Yes    Pain Score  8     Pain Location  Knee    Pain Orientation  Right    Pain Descriptors / Indicators  Discomfort    Pain Type  Acute pain    Pain Onset  More than a month ago    Pain Frequency  Constant    Aggravating Factors   increased activity    Pain Relieving Factors  rest    Pain Score  10    Pain Location  Back    Pain Orientation  Mid    Pain Descriptors / Indicators  Discomfort    Pain Type  Acute pain    Pain Onset  More than a month ago    Pain Frequency  Constant    Aggravating Factors   prolong activity    Pain Relieving Factors  rest                      [facility] Adult PT  Treatment/Exercise - 05/17/17 0001      Moist Heat Therapy   Number Minutes Moist Heat  15 Minutes    Moist Heat Location  Lumbar Spine      Cryotherapy   Number Minutes Cryotherapy  15 Minutes    Cryotherapy Location  Knee right    Type of Cryotherapy  Ice pack      Electrical Stimulation   Electrical Stimulation Location  mid back    Electrical Stimulation Action  IFC    Electrical Stimulation Parameters  80-150hz  x2815min    Electrical Stimulation Goals  Pain;Edema      Manual Therapy   Manual Therapy  Myofascial release;Soft tissue mobilization    Soft tissue mobilization  manual STW to mid and low back and triger point release to bil thoracic and scapular border to reduce tightness and pain  PT Long Term Goals - 05/11/17 1119      PT LONG TERM GOAL #1   Title  Independent with a HEP.    Time  6    Period  Weeks    Status  Achieved      PT LONG TERM GOAL #2   Title  Full right knee extension.    Time  6    Period  Weeks    Status  On-going      PT LONG TERM GOAL #3   Title  Perform a reciprocating stair gait with pain not > 2/10.    Time  6    Period  Weeks    Status  On-going      PT LONG TERM GOAL #4   Title  Stand 20 minutes with pain not > 2-3/10.    Time  6    Period  Weeks    Status  On-going      PT LONG TERM GOAL #5   Title  Sit 30 minutes with pain not > 2-3/10.    Time  6    Period  Weeks    Status  On-going      PT LONG TERM GOAL #6   Title  Perform ADL's with pain not > 3/10.    Time  6    Period  Weeks    Status  On-going            Plan - 05/17/17 1151    Clinical Impression Statement  Today patient tolerated treatment well. Patient requested to hold on exercises today and try next visit. Patient wanted to focus on back today to decrease pain in low and mid back. Patient had decreased tightness and decreased palpable pain. Educated patient on starting gentle stretching and strengthening next treatment for  right knee and mid/low back. Current goals ongoing due to pain defcits.     Rehab Potential  Good    Clinical Impairments Affecting Rehab Potential  High pain-level.    PT Frequency  2x / week    PT Duration  6 weeks    PT Treatment/Interventions  ADLs/Self Care Home Management;Cryotherapy;Electrical Stimulation;Ultrasound;Moist Heat;Gait training;Stair training;Functional mobility training;Therapeutic activities;Therapeutic exercise;Patient/family education;Neuromuscular re-education;Manual techniques;Passive range of motion;Dry needling    PT Next Visit Plan  cont with POC for nustep for LE and core activation/gentle knee stretching to achieve full right knee extension/ progess with pain-free quadriceps exercises (O and CKC);  mid/low back stabilization exercises/STW/M to lower thoracic-lumbar spine/ HMP and electrical stimulation.    Consulted and Agree with Plan of Care  Patient       Patient will benefit from skilled therapeutic intervention in order to improve the following deficits and impairments:  Decreased activity tolerance, Abnormal gait, Decreased range of motion, Increased muscle spasms, Postural dysfunction, Pain  Visit Diagnosis: Acute bilateral low back pain, with sciatica presence unspecified  Acute pain of right knee  Stiffness of right knee, not elsewhere classified     Problem List Patient Active Problem List   Diagnosis Date Noted  . Tobacco use disorder 02/22/2017  . History of hysterectomy 02/22/2017  . Allergic rhinitis due to allergen 02/22/2017  . Chronic fatigue 02/22/2017    Marylene Land, Shawnta Zimbelman P 05/17/2017, 12:09 PM  [facility] [address] [place], [place], [address] Phone: [phone]   Fax:  [phone]  Name: Sara [name] MRN: [id number] Date of Birth: 1960-10-19

## 2017-05-19 ENCOUNTER — Ambulatory Visit: Payer: Worker's Compensation | Admitting: *Deleted

## 2017-05-19 DIAGNOSIS — M25661 Stiffness of right knee, not elsewhere classified: Secondary | ICD-10-CM

## 2017-05-19 DIAGNOSIS — M545 Low back pain: Secondary | ICD-10-CM

## 2017-05-19 DIAGNOSIS — M25561 Pain in right knee: Secondary | ICD-10-CM

## 2017-05-19 NOTE — Therapy (Signed)
[name] [address] [place], [place], [address] Phone: [phone]   Fax:  [phone]  Physical Therapy Treatment  Patient Details  Name: Sara [name]. Nicholl MRN: [id number] Date of Birth: November 01, 1960 Referring Provider: Marcene Corning MD   Encounter Date: 05/19/2017  PT End of Session - 05/19/17 1234    Visit Number  7    Number of Visits  24    Date for PT Re-Evaluation  06/23/17    PT Start Time  1115    PT Stop Time  1205    PT Time Calculation (min)  50 min       Past Medical History:  Diagnosis Date  . Arthritis     Past Surgical History:  Procedure Laterality Date  . ABDOMINAL HYSTERECTOMY  1986  . FRACTURE SURGERY  1979   fx collar bone    There were no vitals filed for this visit.                   [facility] Adult PT Treatment/Exercise - 05/19/17 0001      Modalities   Modalities  Electrical Stimulation;Moist Heat;Ultrasound      Moist Heat Therapy   Number Minutes Moist Heat  15 Minutes    Moist Heat Location  Lumbar Spine      Electrical Stimulation   Electrical Stimulation Location  mid back  IFC 80-150hz  x 15 mins    Electrical Stimulation Goals  Pain;Edema      Ultrasound   Ultrasound Location  LT LB/mid paras     Ultrasound Parameters  1.5 w/cm2 x 10 mins    Ultrasound Goals  Pain      Manual Therapy   Manual Therapy  Myofascial release;Soft tissue mobilization    Soft tissue mobilization  manual STW to mid and low back and triger point release to bil thoracic and scapular border to reduce tightness and pain                  PT Long Term Goals - 05/11/17 1119      PT LONG TERM GOAL #1   Title  Independent with a HEP.    Time  6    Period  Weeks    Status  Achieved      PT LONG TERM GOAL #2   Title  Full right knee extension.    Time  6    Period  Weeks    Status  On-going      PT LONG TERM GOAL #3   Title  Perform a reciprocating stair gait with pain not >  2/10.    Time  6    Period  Weeks    Status  On-going      PT LONG TERM GOAL #4   Title  Stand 20 minutes with pain not > 2-3/10.    Time  6    Period  Weeks    Status  On-going      PT LONG TERM GOAL #5   Title  Sit 30 minutes with pain not > 2-3/10.    Time  6    Period  Weeks    Status  On-going      PT LONG TERM GOAL #6   Title  Perform ADL's with pain not > 3/10.    Time  6    Period  Weeks    Status  On-going            Plan -  05/19/17 1140    Clinical Impression Statement  Pt arrived to clinic doing a little better after last Rx, but still with complaints of thoracolumbar pain LT side. She did fairly well with Rx, but did have notable TPs along LT side paras  during STW. Pt felt better after  Rx with decreased pain and tightness.    Clinical Presentation  Evolving    Clinical Decision Making  Moderate    Rehab Potential  Good    Clinical Impairments Affecting Rehab Potential  High pain-level.    PT Frequency  2x / week    PT Duration  6 weeks    PT Treatment/Interventions  ADLs/Self Care Home Management;Cryotherapy;Electrical Stimulation;Ultrasound;Moist Heat;Gait training;Stair training;Functional mobility training;Therapeutic activities;Therapeutic exercise;Patient/family education;Neuromuscular re-education;Manual techniques;Passive range of motion;Dry needling    PT Next Visit Plan  cont with POC for nustep for LE and core activation/gentle knee stretching to achieve full right knee extension/ progess with pain-free quadriceps exercises (O and CKC);  mid/low back stabilization exercises/STW/M to lower thoracic-lumbar spine/ HMP and electrical stimulation.    Consulted and Agree with Plan of Care  Patient       Patient will benefit from skilled therapeutic intervention in order to improve the following deficits and impairments:  Decreased activity tolerance, Abnormal gait, Decreased range of motion, Increased muscle spasms, Postural dysfunction, Pain  Visit  Diagnosis: Acute bilateral low back pain, with sciatica presence unspecified  Acute pain of right knee  Stiffness of right knee, not elsewhere classified     Problem List Patient Active Problem List   Diagnosis Date Noted  . Tobacco use disorder 02/22/2017  . History of hysterectomy 02/22/2017  . Allergic rhinitis due to allergen 02/22/2017  . Chronic fatigue 02/22/2017    Mckensey Berghuis,CHRIS, PTA 05/19/2017, 12:43 PM  [name] [address] [place], [name], [address] Phone: [phone]   Fax:  [phone]  Name: Sara FisherLisa D. Mariann LasterBowden MRN: [id number] Date of Birth: 1960-11-08

## 2017-05-22 ENCOUNTER — Ambulatory Visit: Payer: Worker's Compensation | Admitting: Physical Therapy

## 2017-05-22 ENCOUNTER — Encounter: Payer: Self-pay | Admitting: Physical Therapy

## 2017-05-22 DIAGNOSIS — M25661 Stiffness of right knee, not elsewhere classified: Secondary | ICD-10-CM

## 2017-05-22 DIAGNOSIS — M545 Low back pain: Secondary | ICD-10-CM | POA: Diagnosis not present

## 2017-05-22 DIAGNOSIS — M25561 Pain in right knee: Secondary | ICD-10-CM

## 2017-05-22 NOTE — Therapy (Signed)
[name] [address] [place], [name], [address] Phone: [phone]   Fax:  [phone]  Physical Therapy Treatment  Patient Details  Name: Sara FisherLisa D. Mariann [name] MRN: [id number] Date of Birth: 1960/09/19 Referring Provider: [name]   Encounter Date: 05/22/2017  PT End of Session - 05/22/17 0757    Visit Number  8    Number of Visits  24    Date for PT Re-Evaluation  06/23/17    PT Start Time  0731    PT Stop Time  0830    PT Time Calculation (min)  59 min    Activity Tolerance  Patient tolerated treatment well    Behavior During Therapy  [facility] for tasks assessed/performed       Past Medical History:  Diagnosis Date  . Arthritis     Past Surgical History:  Procedure Laterality Date  . ABDOMINAL HYSTERECTOMY  1986  . FRACTURE SURGERY  1979   fx collar bone    There were no vitals filed for this visit.  Subjective Assessment - 05/22/17 0741    Subjective  Patient felt increased muscle soreness for a few days and pain seemed to move to different spot in upper neck and shoulders and also heard a pop in rt knee after stretching it this AM     Pertinent History  Patient reports a low back injury many years ago but she was experiencing no pain.    Limitations  Standing;Sitting    Diagnostic tests  X-ray and MRI.    Patient Stated Goals  Get out of pain and back to work.    Currently in Pain?  Yes    Pain Score  9     Pain Location  Knee    Pain Orientation  Right    Pain Descriptors / Indicators  Discomfort    Pain Onset  More than a month ago    Pain Frequency  Constant    Aggravating Factors   increased activity    Pain Relieving Factors  at rest ice    Pain Score  10    Pain Location  Back upper back and shoulders    Pain Orientation  Mid;Upper    Pain Descriptors / Indicators  Discomfort    Pain Type  Acute pain    Pain Onset  More than a month ago    Pain Frequency  Constant    Aggravating Factors   prolong  activity    Pain Relieving Factors  rest                      [facility] Adult PT Treatment/Exercise - 05/22/17 0001      Lumbar Exercises: Supine   Other Supine Lumbar Exercises  seated scap reractions 2x10      Knee/Hip Exercises: Aerobic   Nustep  L2 x5010min UE/LE activity      Knee/Hip Exercises: Supine   Quad Sets  Strengthening;Right;20 reps    Short Arc Quad Sets  Strengthening;Right;10 reps;2 sets;Limitations    Short Arc Quad Sets Limitations  knee discomfort    Straight Leg Raises  Strengthening;Right;20 reps      Knee/Hip Exercises: Sidelying   Hip ABduction  Strengthening;Right;20 reps      Cryotherapy   Number Minutes Cryotherapy  15 Minutes    Cryotherapy Location  Knee    Type of Cryotherapy  Ice pack      Electrical Stimulation   Electrical Stimulation Location  mid  back  IFC 80-150hz  x 15 mins    Electrical Stimulation Goals  Pain;Edema      Manual Therapy   Manual Therapy  Myofascial release;Soft tissue mobilization    Soft tissue mobilization  manual STW to mid and low back and triger point release to bil thoracic and scapular border to reduce tightness and pain                  PT Long Term Goals - 05/11/17 1119      PT LONG TERM GOAL #1   Title  Independent with a HEP.    Time  6    Period  Weeks    Status  Achieved      PT LONG TERM GOAL #2   Title  Full right knee extension.    Time  6    Period  Weeks    Status  On-going      PT LONG TERM GOAL #3   Title  Perform a reciprocating stair gait with pain not > 2/10.    Time  6    Period  Weeks    Status  On-going      PT LONG TERM GOAL #4   Title  Stand 20 minutes with pain not > 2-3/10.    Time  6    Period  Weeks    Status  On-going      PT LONG TERM GOAL #5   Title  Sit 30 minutes with pain not > 2-3/10.    Time  6    Period  Weeks    Status  On-going      PT LONG TERM GOAL #6   Title  Perform ADL's with pain not > 3/10.    Time  6    Period  Weeks     Status  On-going            Plan - 05/22/17 0815    Clinical Impression Statement  Patient arrived with ongoing discomfort yet was able to start pain free strengthening exercises. Patient tolerated treatment well with only discomfort was SAQ with some knee popping sensation reported. Patient felt better after treatment today. Patient current goals ongoing due to pain deficts.     Rehab Potential  Good    Clinical Impairments Affecting Rehab Potential  High pain-level.    PT Frequency  2x / week    PT Duration  6 weeks    PT Treatment/Interventions  ADLs/Self Care Home Management;Cryotherapy;Electrical Stimulation;Ultrasound;Moist Heat;Gait training;Stair training;Functional mobility training;Therapeutic activities;Therapeutic exercise;Patient/family education;Neuromuscular re-education;Manual techniques;Passive range of motion;Dry needling    PT Next Visit Plan  cont with POC for nustep for LE and core activation/gentle knee stretching to achieve full right knee extension/ progess with pain-free quadriceps exercises (O and CKC);  mid/low back stabilization exercises/STW/M to lower thoracic-lumbar spine/ HMP and electrical stimulation.    Consulted and Agree with Plan of Care  Patient       Patient will benefit from skilled therapeutic intervention in order to improve the following deficits and impairments:  Decreased activity tolerance, Abnormal gait, Decreased range of motion, Increased muscle spasms, Postural dysfunction, Pain  Visit Diagnosis: Acute bilateral low back pain, with sciatica presence unspecified  Acute pain of right knee  Stiffness of right knee, not elsewhere classified     Problem List Patient Active Problem List   Diagnosis Date Noted  . Tobacco use disorder 02/22/2017  . History of hysterectomy 02/22/2017  . Allergic rhinitis due to  allergen 02/22/2017  . Chronic fatigue 02/22/2017    Sara [name], PTA 05/22/2017, 8:54 AM  [facility] [address] [place], [place], [address] Phone: [phone]   Fax:  [phone]  Name: Sara [name] MRN: [id number] Date of Birth: [birth year]

## 2017-05-24 ENCOUNTER — Encounter: Payer: Self-pay | Admitting: Physical Therapy

## 2017-05-24 ENCOUNTER — Ambulatory Visit: Payer: Worker's Compensation | Admitting: Physical Therapy

## 2017-05-24 DIAGNOSIS — M545 Low back pain: Secondary | ICD-10-CM

## 2017-05-24 DIAGNOSIS — M25661 Stiffness of right knee, not elsewhere classified: Secondary | ICD-10-CM

## 2017-05-24 DIAGNOSIS — M25561 Pain in right knee: Secondary | ICD-10-CM

## 2017-05-24 NOTE — Therapy (Signed)
[name] [address] [place], [place], [address] Phone: [phone]   Fax:  [phone]  Physical Therapy Treatment  Patient Details  Name: Zaryah Seckel. Vandenheuvel MRN: [id number] Date of Birth: 1960/08/10 Referring Provider: Marcene Corning MD   Encounter Date: 05/24/2017  PT End of Session - 05/24/17 0807    Visit Number  9    Number of Visits  24    Date for PT Re-Evaluation  06/23/17    PT Start Time  0730    PT Stop Time  0817    PT Time Calculation (min)  47 min    Activity Tolerance  Patient tolerated treatment well    Behavior During Therapy  [facility] for tasks assessed/performed       Past Medical History:  Diagnosis Date  . Arthritis     Past Surgical History:  Procedure Laterality Date  . ABDOMINAL HYSTERECTOMY  1986  . FRACTURE SURGERY  1979   fx collar bone    There were no vitals filed for this visit.  Subjective Assessment - 05/24/17 0737    Subjective  Patient reported ongoing discomfort yet did ok after last treatment    Pertinent History  Patient reports a low back injury many years ago but she was experiencing no pain.    Limitations  Standing;Sitting    Diagnostic tests  X-ray and MRI.    Patient Stated Goals  Get out of pain and back to work.    Currently in Pain?  Yes    Pain Score  9     Pain Location  Knee    Pain Orientation  Right    Pain Descriptors / Indicators  Discomfort    Pain Type  Acute pain    Pain Onset  More than a month ago    Pain Frequency  Constant    Aggravating Factors   increased activity    Pain Relieving Factors  rest    Pain Score  10    Pain Location  Back upper back and shoulders    Pain Orientation  Mid;Upper    Pain Descriptors / Indicators  Discomfort    Pain Type  Acute pain    Pain Onset  More than a month ago    Pain Frequency  Constant    Aggravating Factors   prolong activity    Pain Relieving Factors  rest                      [facility] Adult PT  Treatment/Exercise - 05/24/17 0001      Lumbar Exercises: Standing   Scapular Retraction  Strengthening;Both;20 reps;Theraband    Theraband Level (Scapular Retraction)  Other (comment) green XTS    Shoulder Extension  Strengthening;Both;Theraband;20 reps    Theraband Level (Shoulder Extension)  Other (comment) green XTS    Other Standing Lumbar Exercises  wall push ups 2x10      Lumbar Exercises: Supine   Bridge  20 reps;3 seconds      Knee/Hip Exercises: Aerobic   Nustep  L2 x38min UE/LE activity      Knee/Hip Exercises: Supine   Straight Leg Raises  Strengthening;Right;20 reps      Knee/Hip Exercises: Sidelying   Hip ABduction  Strengthening;Right;20 reps    Clams  2x10      Moist Heat Therapy   Number Minutes Moist Heat  15 Minutes    Moist Heat Location  Lumbar Spine      Cryotherapy  Number Minutes Cryotherapy  15 Minutes    Cryotherapy Location  Knee    Type of Cryotherapy  Ice pack      Electrical Stimulation   Electrical Stimulation Location  mid back and rt knee    Electrical Stimulation Action  premod x81min    Electrical Stimulation Goals  Pain;Edema                  PT Long Term Goals - 05/11/17 1119      PT LONG TERM GOAL #1   Title  Independent with a HEP.    Time  6    Period  Weeks    Status  Achieved      PT LONG TERM GOAL #2   Title  Full right knee extension.    Time  6    Period  Weeks    Status  On-going      PT LONG TERM GOAL #3   Title  Perform a reciprocating stair gait with pain not > 2/10.    Time  6    Period  Weeks    Status  On-going      PT LONG TERM GOAL #4   Title  Stand 20 minutes with pain not > 2-3/10.    Time  6    Period  Weeks    Status  On-going      PT LONG TERM GOAL #5   Title  Sit 30 minutes with pain not > 2-3/10.    Time  6    Period  Weeks    Status  On-going      PT LONG TERM GOAL #6   Title  Perform ADL's with pain not > 3/10.    Time  6    Period  Weeks    Status  On-going             Plan - 05/24/17 0810    Clinical Impression Statement  Patienttolerated treatment well today with no reported increased pain. Today focused on pain fee strengthening for right knee and core/posture stabilization. Patient has ongoing discomfort in right knee and upper back. Patient unable to meet any current goals due to pain limitations.     Rehab Potential  Good    Clinical Impairments Affecting Rehab Potential  High pain-level.    PT Frequency  2x / week    PT Duration  6 weeks    PT Treatment/Interventions  ADLs/Self Care Home Management;Cryotherapy;Electrical Stimulation;Ultrasound;Moist Heat;Gait training;Stair training;Functional mobility training;Therapeutic activities;Therapeutic exercise;Patient/family education;Neuromuscular re-education;Manual techniques;Passive range of motion;Dry needling    PT Next Visit Plan  cont with POC for nustep for LE and core activation/gentle knee stretching to achieve full right knee extension/ progess with pain-free quadriceps exercises (O and CKC);  mid/low back stabilization exercises/STW/M to lower thoracic-lumbar spine/ HMP and electrical stimulation.    Consulted and Agree with Plan of Care  Patient       Patient will benefit from skilled therapeutic intervention in order to improve the following deficits and impairments:  Decreased activity tolerance, Abnormal gait, Decreased range of motion, Increased muscle spasms, Postural dysfunction, Pain  Visit Diagnosis: Acute bilateral low back pain, with sciatica presence unspecified  Acute pain of right knee  Stiffness of right knee, not elsewhere classified     Problem List Patient Active Problem List   Diagnosis Date Noted  . Tobacco use disorder 02/22/2017  . History of hysterectomy 02/22/2017  . Allergic rhinitis due to allergen 02/22/2017  .  Chronic fatigue 02/22/2017   Cathie HoopsChristina Wadell Craddock, PTA 05/24/17 8:19 AM  [name] [address] [place], [name], [address] Phone: [phone]   Fax:  [phone]  Name: Belinda FisherLisa D. Mariann LasterBowden MRN: [id number] Date of Birth: December 01, 1960

## 2017-05-25 ENCOUNTER — Encounter: Payer: Self-pay | Admitting: Physical Therapy

## 2017-05-25 ENCOUNTER — Ambulatory Visit: Payer: Worker's Compensation | Admitting: Physical Therapy

## 2017-05-25 DIAGNOSIS — M25561 Pain in right knee: Secondary | ICD-10-CM

## 2017-05-25 DIAGNOSIS — M25661 Stiffness of right knee, not elsewhere classified: Secondary | ICD-10-CM

## 2017-05-25 DIAGNOSIS — M545 Low back pain: Secondary | ICD-10-CM

## 2017-05-25 NOTE — Therapy (Signed)
[name] [address] [place], [place], [address] Phone: [phone]   Fax:  [phone]  Physical Therapy Treatment  Patient Details  Name: Sara [name] MRN: [id number] Date of Birth: 1961/03/19 Referring Provider: Marcene Corning MD   Encounter Date: 05/25/2017  PT End of Session - 05/25/17 1017    Visit Number  10    Number of Visits  24    Date for PT Re-Evaluation  06/23/17    PT Start Time  0946    PT Stop Time  1041    PT Time Calculation (min)  55 min    Activity Tolerance  Patient tolerated treatment well    Behavior During Therapy  [facility] for tasks assessed/performed       Past Medical History:  Diagnosis Date  . Arthritis     Past Surgical History:  Procedure Laterality Date  . ABDOMINAL HYSTERECTOMY  1986  . FRACTURE SURGERY  1979   fx collar bone    There were no vitals filed for this visit.  Subjective Assessment - 05/25/17 1003    Subjective  Patient reported ongoing discomfort yet did ok after last treatment    Pertinent History  Patient reports a low back injury many years ago but she was experiencing no pain.    Limitations  Standing;Sitting    Diagnostic tests  X-ray and MRI.    Patient Stated Goals  Get out of pain and back to work.    Currently in Pain?  Yes    Pain Score  8     Pain Location  Knee    Pain Descriptors / Indicators  Discomfort;Sharp    Pain Type  Acute pain    Pain Onset  More than a month ago    Pain Frequency  Constant    Aggravating Factors   increased activity    Pain Relieving Factors  rest    Pain Score  -- nothing at moment per patient    Pain Location  Back    Pain Orientation  Mid;Upper    Pain Onset  More than a month ago    Pain Frequency  Intermittent    Aggravating Factors   any prolong activity    Pain Relieving Factors  at rest or not doing any activity                      [facility] Adult PT Treatment/Exercise - 05/25/17 0001      Lumbar  Exercises: Standing   Scapular Retraction  Strengthening;Both;20 reps;Theraband    Theraband Level (Scapular Retraction)  Other (comment) green XTS    Shoulder Extension  Strengthening;Both;Theraband;20 reps    Theraband Level (Shoulder Extension)  Other (comment) green XTS    Other Standing Lumbar Exercises  wall push ups 2x10    Other Standing Lumbar Exercises  standing for "W" "V" with red swiss for stabilization 2x10 each      Lumbar Exercises: Supine   Bridge  20 reps;3 seconds      Knee/Hip Exercises: Aerobic   Nustep  L2 x20min UE/LE activity      Knee/Hip Exercises: Supine   Straight Leg Raises  Strengthening;Right;20 reps      Knee/Hip Exercises: Sidelying   Hip ABduction  Strengthening;Right;20 reps    Clams  2x10      Modalities   Modalities  Vasopneumatic;Electrical Stimulation;Moist Heat      Moist Heat Therapy   Number Minutes Moist Heat  15 Minutes  Moist Heat Location  Lumbar Spine      Electrical Stimulation   Electrical Stimulation Location  mid back and rt knee    Electrical Stimulation Action  premod 1-10hz  x5015min    Electrical Stimulation Goals  Pain;Edema      Vasopneumatic   Number Minutes Vasopneumatic   15 minutes    Vasopnuematic Location   Knee right    Vasopneumatic Pressure  Low                  PT Long Term Goals - 05/11/17 1119      PT LONG TERM GOAL #1   Title  Independent with a HEP.    Time  6    Period  Weeks    Status  Achieved      PT LONG TERM GOAL #2   Title  Full right knee extension.    Time  6    Period  Weeks    Status  On-going      PT LONG TERM GOAL #3   Title  Perform a reciprocating stair gait with pain not > 2/10.    Time  6    Period  Weeks    Status  On-going      PT LONG TERM GOAL #4   Title  Stand 20 minutes with pain not > 2-3/10.    Time  6    Period  Weeks    Status  On-going      PT LONG TERM GOAL #5   Title  Sit 30 minutes with pain not > 2-3/10.    Time  6    Period  Weeks     Status  On-going      PT LONG TERM GOAL #6   Title  Perform ADL's with pain not > 3/10.    Time  6    Period  Weeks    Status  On-going            Plan - 05/25/17 1027    Clinical Impression Statement  Patient tolerated treatment well today. Patient able to progress with strengthening exercises pain free today with improved activity tolerance. Patient has reported less pain overall in back and none upon arrival. Goals progressing. Started VASO per MPT today.     Rehab Potential  Good    Clinical Impairments Affecting Rehab Potential  High pain-level.    PT Frequency  2x / week    PT Duration  6 weeks    PT Treatment/Interventions  ADLs/Self Care Home Management;Cryotherapy;Electrical Stimulation;Ultrasound;Moist Heat;Gait training;Stair training;Functional mobility training;Therapeutic activities;Therapeutic exercise;Patient/family education;Neuromuscular re-education;Manual techniques;Passive range of motion;Dry needling;Vasopneumatic Device    PT Next Visit Plan  cont with POC for nustep for LE and core activation/gentle knee stretching to achieve full right knee extension/ progess with pain-free quadriceps exercises (O and CKC);  mid/low back stabilization exercises/STW/M to lower thoracic-lumbar spine/ HMP and electrical stimulation.    Consulted and Agree with Plan of Care  Patient       Patient will benefit from skilled therapeutic intervention in order to improve the following deficits and impairments:  Decreased activity tolerance, Abnormal gait, Decreased range of motion, Increased muscle spasms, Postural dysfunction, Pain  Visit Diagnosis: Acute bilateral low back pain, with sciatica presence unspecified  Stiffness of right knee, not elsewhere classified  Acute pain of right knee     Problem List Patient Active Problem List   Diagnosis Date Noted  . Tobacco use disorder 02/22/2017  . History  of hysterectomy 02/22/2017  . Allergic rhinitis due to allergen  02/22/2017  . Chronic fatigue 02/22/2017    Roniesha Hollingshead P, PTA 05/25/2017, 10:44 AM  [facility] [address] [place], [place], [address] Phone: [phone]   Fax:  [phone]  Name: Sara [name] MRN: [id number] Date of Birth: 10-11-1960

## 2017-05-26 ENCOUNTER — Encounter: Payer: Worker's Compensation | Admitting: Physical Therapy

## 2017-05-29 ENCOUNTER — Encounter: Payer: Worker's Compensation | Admitting: Physical Therapy

## 2017-05-30 ENCOUNTER — Ambulatory Visit: Payer: Worker's Compensation | Admitting: Physical Therapy

## 2017-05-30 DIAGNOSIS — M545 Low back pain: Secondary | ICD-10-CM

## 2017-05-30 DIAGNOSIS — M25661 Stiffness of right knee, not elsewhere classified: Secondary | ICD-10-CM

## 2017-05-30 DIAGNOSIS — M25561 Pain in right knee: Secondary | ICD-10-CM

## 2017-05-30 NOTE — Therapy (Signed)
[name] [address] [place], [name], [address] Phone: [phone]   Fax:  [phone]  Physical Therapy Treatment  Patient Details  Name: Sara FisherLisa D. Mariann LasterBowden MRN: [id number] Date of Birth: 11/02/1960 Referring Provider: [name]   Encounter Date: 05/30/2017  PT End of Session - 05/30/17 1052    Visit Number  11    Number of Visits  24    Date for PT Re-Evaluation  06/23/17    PT Start Time  1033    PT Stop Time  1125    PT Time Calculation (min)  52 min       Past Medical History:  Diagnosis Date  . Arthritis     Past Surgical History:  Procedure Laterality Date  . ABDOMINAL HYSTERECTOMY  1986  . FRACTURE SURGERY  1979   fx collar bone    There were no vitals filed for this visit.  Subjective Assessment - 05/30/17 1157    Subjective  Patient reported knee and mid back has been improving as seen by increased fuctional activities and improved activity tolerance since last week. Patient stated knee has not been clicking and feels more stable. Patient also reported decreased pain in mid back when someone hugs her firmly.    Pertinent History  Patient reports a low back injury many years ago but she was experiencing no pain.    Limitations  Standing;Sitting    Patient Stated Goals  Get out of pain and back to work.    Currently in Pain?  No/denies stiffness    Pain Location  Knee    Pain Onset  More than a month ago    Pain Frequency  Intermittent    Aggravating Factors   increased activity    Pain Relieving Factors  ice and rest    Pain Score  2    Pain Orientation  Left;Mid    Pain Descriptors / Indicators  Tender    Pain Type  Acute pain    Pain Onset  More than a month ago                      [facility] Treatment/Exercise - 05/30/17 1044      Exercises   Exercises  Knee/Hip;Lumbar      Lumbar Exercises: Standing   Scapular Retraction  Strengthening;20 reps    Theraband Level  (Scapular Retraction)  Level 2 (Red)    Shoulder Extension  Strengthening;20 reps    Theraband Level (Shoulder Extension)  Level 2 (Red)      Lumbar Exercises: Supine   Pelvic Tilt  20 reps;5 seconds    Clam  20 reps yellow Theraband    Bridge  20 reps    Other Supine Lumbar Exercises  --      Knee/Hip Exercises: Standing   Forward Step Up  2 sets;10 reps;Hand Hold: 2;Step Height: 4"    Other Standing Knee Exercises  1/4 squats with both UE support x17 Tactile cue required at knee to prevent knees going over toe      Knee/Hip Exercises: Sidelying   Hip ABduction  Strengthening;Both;2 sets;10 reps      Modalities   Modalities  Electrical Stimulation;Vasopneumatic      Electrical Stimulation   Electrical Stimulation Location  L mid back    Electrical Stimulation Action  premod x15 min    Electrical Stimulation Goals  Pain;Edema      Vasopneumatic   Number Minutes Vasopneumatic  15 minutes    Vasopnuematic Location   Knee    Vasopneumatic Pressure  Medium      Manual Therapy   Manual Therapy  Myofascial release    Myofascial Release  Left trigger point release to rhomboid major              PT Education - 05/30/17 1216    Education provided  Yes    Education Details  Ice with elevation and ankle pumps to decrease knee edema    Person(s) Educated  Patient    Methods  Explanation    Comprehension  Verbalized understanding;Returned demonstration          PT Long Term Goals - 05/11/17 1119      PT LONG TERM GOAL #1   Title  Independent with a HEP.    Time  6    Period  Weeks    Status  Achieved      PT LONG TERM GOAL #2   Title  Full right knee extension.    Time  6    Period  Weeks    Status  On-going      PT LONG TERM GOAL #3   Title  Perform a reciprocating stair gait with pain not > 2/10.    Time  6    Period  Weeks    Status  On-going      PT LONG TERM GOAL #4   Title  Stand 20 minutes with pain not > 2-3/10.    Time  6    Period  Weeks     Status  On-going      PT LONG TERM GOAL #5   Title  Sit 30 minutes with pain not > 2-3/10.    Time  6    Period  Weeks    Status  On-going      PT LONG TERM GOAL #6   Title  Perform ADL's with pain not > 3/10.    Time  6    Period  Weeks    Status  On-going            Plan - 05/30/17 1150    Clinical Impression Statement  Patient was able to tolerate treatment well as seen by the ability to complete therapeutic exercises without increase pain in back. Patient required tacticle cuing at both knees for proper form for 1/4 squats. Thoracic spine mobilized for assessment. Left trigger point found at rhomboid major around medial border of scapula. Trigger point was very tender to palpation and was relieved after premod. Normal response to vasopneumatic device and e-stim.    Rehab Potential  Good    Clinical Impairments Affecting Rehab Potential  High pain-level.    PT Frequency  2x / week    PT Duration  6 weeks    PT Treatment/Interventions  ADLs/Self Care Home Management;Cryotherapy;Electrical Stimulation;Ultrasound;Moist Heat;Gait training;Stair training;Functional mobility training;Therapeutic activities;Therapeutic exercise;Patient/family education;Neuromuscular re-education;Manual techniques;Passive range of motion;Dry needling;Vasopneumatic Device    PT Next Visit Plan  Continue plan of care to improve function. Begin Left Trigger point release and T-spine mobilization to address pain and improve thoracic spine mobility.    Consulted and Agree with Plan of Care  Patient       Patient will benefit from skilled therapeutic intervention in order to improve the following deficits and impairments:  Decreased activity tolerance, Abnormal gait, Decreased range of motion, Increased muscle spasms, Postural dysfunction, Pain  Visit Diagnosis: Acute bilateral low back pain, with sciatica  presence unspecified  Stiffness of right knee, not elsewhere classified  Acute pain of right  knee     Problem List Patient Active Problem List   Diagnosis Date Noted  . Tobacco use disorder 02/22/2017  . History of hysterectomy 02/22/2017  . Allergic rhinitis due to allergen 02/22/2017  . Chronic fatigue 02/22/2017    [name], PT, DPT 05/30/2017, 12:55 PM  [name] [address] [place], [place], [address] Phone: [phone]   Fax:  [phone]  Name: Jonasia Coiner. Bogie MRN: [id number] Date of Birth: [birth year]

## 2017-05-31 ENCOUNTER — Ambulatory Visit: Payer: Worker's Compensation | Admitting: Physical Therapy

## 2017-05-31 DIAGNOSIS — M25561 Pain in right knee: Secondary | ICD-10-CM

## 2017-05-31 DIAGNOSIS — M25661 Stiffness of right knee, not elsewhere classified: Secondary | ICD-10-CM

## 2017-05-31 DIAGNOSIS — M545 Low back pain: Secondary | ICD-10-CM

## 2017-05-31 NOTE — Therapy (Signed)
[name] [address] [place], [place], [address] Phone: [phone]   Fax:  [phone]  Physical Therapy Treatment  Patient Details  Name: Sara [name] MRN: [id number] Date of Birth: 1961-02-27 Referring Provider: Marcene Corning MD   Encounter Date: 05/31/2017  PT End of Session - 05/31/17 1042    Visit Number  12    Number of Visits  24    Date for PT Re-Evaluation  06/23/17    PT Start Time  1031    PT Stop Time  1128    PT Time Calculation (min)  57 min    Activity Tolerance  Patient tolerated treatment well    Behavior During Therapy  [facility] for tasks assessed/performed       Past Medical History:  Diagnosis Date  . Arthritis     Past Surgical History:  Procedure Laterality Date  . ABDOMINAL HYSTERECTOMY  1986  . FRACTURE SURGERY  1979   fx collar bone    There were no vitals filed for this visit.  Subjective Assessment - 05/31/17 1042    Subjective  Patient reported "awful" pain in left mid-back in the trigger point area after light pressure/trigger point release yesterday.  Patient reported 20/10 pain at trigger point and was unable to sleep last night. Patient took motrin for pain relief but did not take muscle relaxer because "it makes her loopy." Knees are sore but not uncomfortable.    Currently in Pain?  Yes    Pain Location  Knee    Pain Orientation  Right    Pain Descriptors / Indicators  Aching;Dull;Sore    Pain Frequency  Intermittent    Pain Score  10 20/10    Pain Location  Thoracic    Pain Orientation  Left;Mid    Pain Descriptors / Indicators  Tender    Pain Type  Acute pain    Pain Frequency  Constant                      [facility] Adult PT Treatment/Exercise - 05/31/17 1059      Exercises   Exercises  Lumbar      Lumbar Exercises: Stretches   Other Lumbar Stretch Exercise  3 way prayer stretch on counter, 3x20 seconds each      Lumbar Exercises: Aerobic   Stationary Bike  10  minutes level 1      Lumbar Exercises: Standing   Scapular Retraction  Strengthening    Theraband Level (Scapular Retraction)  Level 2 (Red)    Shoulder Extension  Strengthening    Theraband Level (Shoulder Extension)  Level 2 (Red)      Lumbar Exercises: Supine   Ab Set  20 reps;2 seconds    Pelvic Tilt  20 reps;5 seconds    Bridge  20 reps    Straight Leg Raise  20 reps      Modalities   Modalities  Electrical Stimulation      Moist Heat Therapy   Number Minutes Moist Heat  10 Minutes    Moist Heat Location  Lumbar Spine thoracic spine      Electrical Stimulation   Electrical Stimulation Location  left mid back    Electrical Stimulation Action  premod x10    Electrical Stimulation Goals  Pain             PT Education - 05/30/17 1216    Education provided  Yes    Education Details  [employer]  with elevation and ankle pumps to decrease knee edema    Person(s) Educated  Patient    Methods  Explanation    Comprehension  Verbalized understanding;Returned demonstration          PT Long Term Goals - 05/11/17 1119      PT LONG TERM GOAL #1   Title  Independent with a HEP.    Time  6    Period  Weeks    Status  Achieved      PT LONG TERM GOAL #2   Title  Full right knee extension.    Time  6    Period  Weeks    Status  On-going      PT LONG TERM GOAL #3   Title  Perform a reciprocating stair gait with pain not > 2/10.    Time  6    Period  Weeks    Status  On-going      PT LONG TERM GOAL #4   Title  Stand 20 minutes with pain not > 2-3/10.    Time  6    Period  Weeks    Status  On-going      PT LONG TERM GOAL #5   Title  Sit 30 minutes with pain not > 2-3/10.    Time  6    Period  Weeks    Status  On-going      PT LONG TERM GOAL #6   Title  Perform ADL's with pain not > 3/10.    Time  6    Period  Weeks    Status  On-going            Plan - 05/31/17 1125    Clinical Impression Statement  Patient was able to tolerate treatment despite  starting physical therapy in "20/10 pain" Exercises were reverted to low level strengthening and stretching to decrease pain. Patient educated on the importance of movement to the limit of pain and not beyond it. Pt reported understanding. Patient reported pain has decreased after moist heat and e-stim.     Rehab Potential  Good    Clinical Impairments Affecting Rehab Potential  High pain-level.    PT Frequency  2x / week    PT Duration  6 weeks    PT Treatment/Interventions  ADLs/Self Care Home Management;Cryotherapy;Electrical Stimulation;Ultrasound;Moist Heat;Gait training;Stair training;Functional mobility training;Therapeutic activities;Therapeutic exercise;Patient/family education;Neuromuscular re-education;Manual techniques;Passive range of motion;Dry needling;Vasopneumatic Device    PT Next Visit Plan  Continue plan of care, reassess trigger point and thoracic spine mobilization.    Consulted and Agree with Plan of Care  Patient       Patient will benefit from skilled therapeutic intervention in order to improve the following deficits and impairments:  Decreased activity tolerance, Abnormal gait, Decreased range of motion, Increased muscle spasms, Postural dysfunction, Pain  Visit Diagnosis: Acute bilateral low back pain, with sciatica presence unspecified  Stiffness of right knee, not elsewhere classified  Acute pain of right knee     Problem List Patient Active Problem List   Diagnosis Date Noted  . Tobacco use disorder 02/22/2017  . History of hysterectomy 02/22/2017  . Allergic rhinitis due to allergen 02/22/2017  . Chronic fatigue 02/22/2017   Guss BundeKrystle Mercades Bajaj, PT, DPT 05/31/2017, 12:11 PM  [name] [address] [place], [name], [address] Phone: [phone]   Fax:  [phone]  Name: Sara [name] MRN: [id number] Date of Birth: October 26, 1960

## 2017-06-01 ENCOUNTER — Ambulatory Visit: Payer: Worker's Compensation | Admitting: Physical Therapy

## 2017-06-01 DIAGNOSIS — M25561 Pain in right knee: Secondary | ICD-10-CM

## 2017-06-01 DIAGNOSIS — M545 Low back pain: Secondary | ICD-10-CM

## 2017-06-01 DIAGNOSIS — M25661 Stiffness of right knee, not elsewhere classified: Secondary | ICD-10-CM

## 2017-06-01 NOTE — Therapy (Signed)
[name] [address] [place], [place], [address] Phone: [phone]   Fax:  [phone]  Physical Therapy Treatment  Patient Details  Name: Sara [name]. Warn MRN: [id number] Date of Birth: 11/03/1960 Referring Provider: Marcene Corning MD   Encounter Date: 06/01/2017  PT End of Session - 06/01/17 0908    Visit Number  13    Number of Visits  24    Date for PT Re-Evaluation  06/23/17    PT Start Time  0900    PT Stop Time  0958    PT Time Calculation (min)  58 min    Activity Tolerance  Patient tolerated treatment well    Behavior During Therapy  [facility] for tasks assessed/performed       Past Medical History:  Diagnosis Date  . Arthritis     Past Surgical History:  Procedure Laterality Date  . ABDOMINAL HYSTERECTOMY  1986  . FRACTURE SURGERY  1979   fx collar bone    There were no vitals filed for this visit.  Subjective Assessment - 06/01/17 0954    Subjective  Patient reported feeling much better after taking motrin and a muscle relaxer last night. She reported "function has improved to 45% percent."    Pertinent History  Patient reports a low back injury many years ago but she was experiencing no pain.    Currently in Pain?  Other (Comment) unable to provide number, function reported at 45%                      [facility] Adult PT Treatment/Exercise - 06/01/17 0001      Exercises   Exercises  Lumbar;Knee/Hip      Lumbar Exercises: Standing   Scapular Retraction  Strengthening    Theraband Level (Scapular Retraction)  Level 2 (Red)    Shoulder Extension  Strengthening    Theraband Level (Shoulder Extension)  Level 2 (Red)      Lumbar Exercises: Supine   Bridge with clamshell  20 reps;3 seconds      Knee/Hip Exercises: Standing   Hip Abduction  Stengthening;Both;2 sets;10 reps;Knee straight    Abduction Limitations  2# ankle weight    Forward Step Up  2 sets;Both;10 reps;Step Height: 6";Hand Hold: 1 L  rail      Insurance claims handler Stimulation Location  Left mid back    Electrical Stimulation Action  premod x10    Electrical Stimulation Goals  Pain      Vasopneumatic   Number Minutes Vasopneumatic   10 minutes    Vasopnuematic Location   Knee    Vasopneumatic Pressure  Medium      Manual Therapy   Manual Therapy  Myofascial release    Myofascial Release  left trigger point release                  PT Long Term Goals - 05/11/17 1119      PT LONG TERM GOAL #1   Title  Independent with a HEP.    Time  6    Period  Weeks    Status  Achieved      PT LONG TERM GOAL #2   Title  Full right knee extension.    Time  6    Period  Weeks    Status  On-going      PT LONG TERM GOAL #3   Title  Perform a reciprocating stair gait with pain not > 2/10.  Time  6    Period  Weeks    Status  On-going      PT LONG TERM GOAL #4   Title  Stand 20 minutes with pain not > 2-3/10.    Time  6    Period  Weeks    Status  On-going      PT LONG TERM GOAL #5   Title  Sit 30 minutes with pain not > 2-3/10.    Time  6    Period  Weeks    Status  On-going      PT LONG TERM GOAL #6   Title  Perform ADL's with pain not > 3/10.    Time  6    Period  Weeks    Status  On-going            Plan - 06/01/17 0958    Clinical Impression Statement  Patient was able to tolerate treatment however required multiple verbal and tactile cues to improve posture and form. Patient was able to complete remainder of exercises with good form 75% of the time without cuing. Patient attempted doorway stretch to address postural deficits however, patient reported "tingling" down the left leg. Form adjustments were made however sensation still persisted; stretch was terminated. Patient feeling good post treatment and rated pain in both back and knee 7/10. No adverse affects found upon the removal of modalities.     PT Frequency  2x / week    PT Duration  6 weeks    PT  Treatment/Interventions  ADLs/Self Care Home Management;Cryotherapy;Electrical Stimulation;Ultrasound;Moist Heat;Gait training;Stair training;Functional mobility training;Therapeutic activities;Therapeutic exercise;Patient/family education;Neuromuscular re-education;Manual techniques;Passive range of motion;Dry needling;Vasopneumatic Device    PT Next Visit Plan  Continue plan of care to improve function and quality of life.       Patient will benefit from skilled therapeutic intervention in order to improve the following deficits and impairments:  Decreased activity tolerance, Abnormal gait, Decreased range of motion, Increased muscle spasms, Postural dysfunction, Pain  Visit Diagnosis: Acute bilateral low back pain, with sciatica presence unspecified  Stiffness of right knee, not elsewhere classified  Acute pain of right knee     Problem List Patient Active Problem List   Diagnosis Date Noted  . Tobacco use disorder 02/22/2017  . History of hysterectomy 02/22/2017  . Allergic rhinitis due to allergen 02/22/2017  . Chronic fatigue 02/22/2017   Guss BundeKrystle Elvia Aydin, PT, DPT 06/01/2017, 10:23 AM  [name] [address] [place], [name], [address] Phone: [phone]   Fax:  [phone]  Name: Sara FisherLisa D. Mariann [name] MRN: [id number] Date of Birth: 07-08-1960

## 2017-06-06 ENCOUNTER — Ambulatory Visit: Payer: Worker's Compensation | Admitting: Physical Therapy

## 2017-06-06 DIAGNOSIS — M545 Low back pain: Secondary | ICD-10-CM | POA: Diagnosis not present

## 2017-06-06 DIAGNOSIS — M25661 Stiffness of right knee, not elsewhere classified: Secondary | ICD-10-CM

## 2017-06-06 DIAGNOSIS — M25561 Pain in right knee: Secondary | ICD-10-CM

## 2017-06-06 NOTE — Therapy (Signed)
[name] [address] [place], [place], [address] Phone: [phone]   Fax:  [phone]  Physical Therapy Treatment  Patient Details  Name: Sara [name] MRN: [id number] Date of Birth: 1961/03/13 Referring Provider: Marcene Corning MD   Encounter Date: 06/06/2017  PT End of Session - 06/06/17 1040    Visit Number  14    Number of Visits  24    Date for PT Re-Evaluation  06/23/17    PT Start Time  1033    PT Stop Time  1125    PT Time Calculation (min)  52 min    Activity Tolerance  Patient tolerated treatment well    Behavior During Therapy  [facility] for tasks assessed/performed       Past Medical History:  Diagnosis Date  . Arthritis     Past Surgical History:  Procedure Laterality Date  . ABDOMINAL HYSTERECTOMY  1986  . FRACTURE SURGERY  1979   fx collar bone    There were no vitals filed for this visit.  Subjective Assessment - 06/06/17 1132    Subjective  Patient reported feeling pain across the mid-back over the weekend. Patient reported "function is about 40%." Patient states she has an appointment with her doctor, Dr. Samul Dada tomorrow for a follow up for her right knee. She states she is still concerned with the right knee swelling and wants the doctor to order an MRI.    Pain Location  Knee    Pain Orientation  Right    Pain Descriptors / Indicators  Aching    Pain Location  Back    Pain Orientation  Left;Mid    Pain Descriptors / Indicators  Tender    Pain Type  Acute pain         [facility] PT Assessment - 06/06/17 0001      ROM / Strength   AROM / PROM / Strength  AROM      AROM   Overall AROM   Within functional limits for tasks performed    Overall AROM Comments  Right knee flexion WNL, Right knee extension -10 degrees                  [facility] Adult PT Treatment/Exercise - 06/06/17 0001      Exercises   Exercises  Lumbar;Knee/Hip      Lumbar Exercises: Stretches   Other Lumbar Stretch  Exercise  3 way prayer stretch seated with stool 3x30 each      Lumbar Exercises: Standing   Scapular Retraction  Strengthening;Both;20 reps    Theraband Level (Scapular Retraction)  Level 2 (Red)      Lumbar Exercises: Supine   Pelvic Tilt  20 reps with marching      Knee/Hip Exercises: Standing   Wall Squat  2 sets;10 reps    Walking with Sports Cord  Lateral walking with red band 3 mins each side      Modalities   Modalities  Electrical Stimulation      Electrical Stimulation   Electrical Stimulation Location  mid back    Electrical Stimulation Action  Premod    Electrical Stimulation Parameters  80-150 x10 min    Electrical Stimulation Goals  Pain      Vasopneumatic   Number Minutes Vasopneumatic   10 minutes    Vasopnuematic Location   Knee    Vasopneumatic Pressure  Medium  PT Long Term Goals - 05/11/17 1119      PT LONG TERM GOAL #1   Title  Independent with a HEP.    Time  6    Period  Weeks    Status  Achieved      PT LONG TERM GOAL #2   Title  Full right knee extension.    Time  6    Period  Weeks    Status  On-going      PT LONG TERM GOAL #3   Title  Perform a reciprocating stair gait with pain not > 2/10.    Time  6    Period  Weeks    Status  On-going      PT LONG TERM GOAL #4   Title  Stand 20 minutes with pain not > 2-3/10.    Time  6    Period  Weeks    Status  On-going      PT LONG TERM GOAL #5   Title  Sit 30 minutes with pain not > 2-3/10.    Time  6    Period  Weeks    Status  On-going      PT LONG TERM GOAL #6   Title  Perform ADL's with pain not > 3/10.    Time  6    Period  Weeks    Status  On-going            Plan - 06/06/17 1136    Clinical Impression Statement  Patient was able to tolerate treatment however patient still requires multiple cuing for posture and form. Patient observed to distribute weight to the right while performing wall slides. Patient experienced crepitus in right knee  with discomfort with wall slides Patient's form was adjusted and patient was provided with a mirror as a visual cue for form. Crepitus reduced following proper form. Patient and PT discussed importance of continued exercise for strength, endurance, and management of pain. Patient stated understanding. No adverse affects found upon removal of E-stim or vasopneumatic device.    Rehab Potential  Good    Clinical Impairments Affecting Rehab Potential  High pain-level.    PT Frequency  2x / week    PT Duration  6 weeks    PT Treatment/Interventions  ADLs/Self Care Home Management;Cryotherapy;Electrical Stimulation;Ultrasound;Moist Heat;Gait training;Stair training;Functional mobility training;Therapeutic activities;Therapeutic exercise;Patient/family education;Neuromuscular re-education;Manual techniques;Passive range of motion;Dry needling;Vasopneumatic Device    PT Next Visit Plan  Continue plan of care to improve strength for functional activities.    Consulted and Agree with Plan of Care  Patient       Patient will benefit from skilled therapeutic intervention in order to improve the following deficits and impairments:  Decreased activity tolerance, Abnormal gait, Decreased range of motion, Increased muscle spasms, Postural dysfunction, Pain  Visit Diagnosis: Acute bilateral low back pain, with sciatica presence unspecified  Stiffness of right knee, not elsewhere classified  Acute pain of right knee     Problem List Patient Active Problem List   Diagnosis Date Noted  . Tobacco use disorder 02/22/2017  . History of hysterectomy 02/22/2017  . Allergic rhinitis due to allergen 02/22/2017  . Chronic fatigue 02/22/2017    Guss BundeKrystle Virna Livengood, PT, DPT 06/06/2017, 11:58 AM  [name] [address] [place], [name], [address] Phone: [phone]   Fax:  [phone]  Name: Sara [name] MRN: [id number] Date of Birth: [birth year]

## 2017-06-08 ENCOUNTER — Ambulatory Visit: Payer: Worker's Compensation | Admitting: Physical Therapy

## 2017-06-08 DIAGNOSIS — M545 Low back pain: Secondary | ICD-10-CM | POA: Diagnosis not present

## 2017-06-08 DIAGNOSIS — M25661 Stiffness of right knee, not elsewhere classified: Secondary | ICD-10-CM

## 2017-06-08 DIAGNOSIS — M25561 Pain in right knee: Secondary | ICD-10-CM

## 2017-06-08 NOTE — Therapy (Signed)
[name] [address] [place], [place], [address] Phone: [phone]   Fax:  [phone]  Physical Therapy Treatment  Patient Details  Name: Sara [name] MRN: [id number] Date of Birth: 18-Sep-1960 Referring Provider: Marcene Corning MD   Encounter Date: 06/08/2017  PT End of Session - 06/08/17 0903    Visit Number  15    Number of Visits  24    Date for PT Re-Evaluation  06/23/17    PT Start Time  0904    PT Stop Time  0953    PT Time Calculation (min)  49 min    Activity Tolerance  Patient tolerated treatment well    Behavior During Therapy  [facility] for tasks assessed/performed       Past Medical History:  Diagnosis Date  . Arthritis     Past Surgical History:  Procedure Laterality Date  . ABDOMINAL HYSTERECTOMY  1986  . FRACTURE SURGERY  1979   fx collar bone    There were no vitals filed for this visit.  Subjective Assessment - 06/08/17 0945    Subjective  Patient reported feeling "okay". She arrived with knee braced donned to protect the knee. Patient reported her follow up appointment with Dr. Fara Chute went well. Patient reported she will get an MRI upon approval. Patient stated she will see Dr. Yevette Edwards for a follow up appointment for her back on Monday 06/12/2017. 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 Patient reports an overall 50% limitation in her functional activities.    Limitations  Standing;Sitting    Patient Stated Goals  Get out of pain and back to work.    Currently in Pain?  -- Patient states 50% of full function    Pain Location  Knee    Pain Orientation  Right    Pain Score  -- 50% limited in function    Pain Location  Back    Pain Orientation  Mid;Left         [facility] PT Assessment - 06/08/17 0001      ROM / Strength   AROM / PROM / Strength  AROM      AROM   Overall AROM   Within functional limits for tasks performed    Overall AROM Comments  Lumbar AROM WFL in all planes    AROM Assessment Site  --                  [facility] Adult PT Treatment/Exercise - 06/08/17 0001      Exercises   Exercises  Lumbar;Knee/Hip      Lumbar Exercises: Standing   Scapular Retraction  Strengthening    Theraband Level (Scapular Retraction)  Level 2 (Red)    Shoulder Extension  Strengthening    Theraband Level (Shoulder Extension)  Level 2 (Red)    Other Standing Lumbar Exercises  Standing Horizontal ABD "T's" with Greentheraband      Lumbar Exercises: Supine   Pelvic Tilt  20 reps with marching      Lumbar Exercises: Sidelying   Clam  Both;20 reps      Knee/Hip Exercises: Supine   Bridges with Ball Squeeze  Strengthening;Both;20 reps with alternating knee extension at top of bridge    Other Supine Knee/Hip Exercises  supine Hip abduction with Red band x15      Modalities   Modalities  Electrical Stimulation      Electrical Stimulation  Electrical Stimulation Location  left Mid back    Electrical Stimulation Action  Premod    Electrical Stimulation Parameters  80-150 x10 minutes    Electrical Stimulation Goals  Pain                  PT Long Term Goals - 05/11/17 1119      PT LONG TERM GOAL #1   Title  Independent with a HEP.    Time  6    Period  Weeks    Status  Achieved      PT LONG TERM GOAL #2   Title  Full right knee extension.    Time  6    Period  Weeks    Status  On-going      PT LONG TERM GOAL #3   Title  Perform a reciprocating stair gait with pain not > 2/10.    Time  6    Period  Weeks    Status  On-going      PT LONG TERM GOAL #4   Title  Stand 20 minutes with pain not > 2-3/10.    Time  6    Period  Weeks    Status  On-going      PT LONG TERM GOAL #5   Title  Sit 30 minutes with pain not > 2-3/10.    Time  6    Period  Weeks    Status  On-going      PT LONG TERM GOAL #6   Title  Perform ADL's with pain not > 3/10.    Time  6    Period  Weeks     Status  On-going            Plan - 06/08/17 0944    Clinical Impression Statement  Patient was able to tolerate treatment well as seen by no increase of pain or discomfort. Patient continues to have discomfort at L midback trigger point at rest and with light overpressure; patient feels relief after E-Stim. Patient's lumbar AROM WFL. No adverse affects noted upon removal of E-stim.    Rehab Potential  Good    PT Frequency  2x / week    PT Duration  6 weeks    PT Treatment/Interventions  ADLs/Self Care Home Management;Cryotherapy;Electrical Stimulation;Ultrasound;Moist Heat;Gait training;Stair training;Functional mobility training;Therapeutic activities;Therapeutic exercise;Patient/family education;Neuromuscular re-education;Manual techniques;Passive range of motion;Dry needling;Vasopneumatic Device    PT Next Visit Plan  Continue plan of care to improve strength for functional activities.    Consulted and Agree with Plan of Care  Patient       Patient will benefit from skilled therapeutic intervention in order to improve the following deficits and impairments:  Decreased activity tolerance, Abnormal gait, Decreased range of motion, Increased muscle spasms, Postural dysfunction, Pain  Visit Diagnosis: Acute bilateral low back pain, with sciatica presence unspecified  Stiffness of right knee, not elsewhere classified  Acute pain of right knee     Problem List Patient Active Problem List   Diagnosis Date Noted  . Tobacco use disorder 02/22/2017  . History of hysterectomy 02/22/2017  . Allergic rhinitis due to allergen 02/22/2017  . Chronic fatigue 02/22/2017   Guss BundeKrystle Sieara Bremer, PT, DPT 06/08/2017, 10:20 AM  [name] [address] [place], [name], [address] Phone: [phone]   Fax:  [phone]  Name: Sara [name] MRN: [id number] Date of Birth: 1960/08/06

## 2017-06-13 ENCOUNTER — Ambulatory Visit: Payer: Worker's Compensation | Admitting: Physical Therapy

## 2017-06-13 DIAGNOSIS — M545 Low back pain: Secondary | ICD-10-CM | POA: Diagnosis not present

## 2017-06-13 DIAGNOSIS — M25661 Stiffness of right knee, not elsewhere classified: Secondary | ICD-10-CM

## 2017-06-13 DIAGNOSIS — M25561 Pain in right knee: Secondary | ICD-10-CM

## 2017-06-13 NOTE — Therapy (Signed)
[name] [address] [place], [name], [address] Phone: [phone]   Fax:  [phone]  Physical Therapy Treatment  Patient Details  Name: Sara [name] MRN: [id number] Date of Birth: January 15, 1961 Referring Provider: [name]   Encounter Date: 06/13/2017  PT End of Session - 06/13/17 1034    Visit Number  16    Number of Visits  36    Date for PT Re-Evaluation  08/04/17    PT Start Time  1032    PT Stop Time  1129    PT Time Calculation (min)  57 min       Past Medical History:  Diagnosis Date  . Arthritis     Past Surgical History:  Procedure Laterality Date  . ABDOMINAL HYSTERECTOMY  1986  . FRACTURE SURGERY  1979   fx collar bone    There were no vitals filed for this visit.  Subjective Assessment - 06/13/17 1035    Subjective  Back hurt all day yesterday but its not hurting today. Overall had a positive doctor's appointment and doctors has ordered an new referral for another 4-6 weeks of therapy. [facility] suggested patient to buy a portable tens unit for pain relief. MRI for R knee is on Thursday, Jan 31st, then follow up with Dr. Jerl Santosalldorf for results. Patient stated she has another follow up visit with Dr. Yevette Edwardsumonski on March 4. Patient is possibly returning to work the week of March 4 pending on the follow up visit.    Currently in Pain?  -- 50% limited function                      [facility] Adult PT Treatment/Exercise - 06/13/17 0001      Exercises   Exercises  Lumbar;Knee/Hip      Lumbar Exercises: Aerobic   Nustep  Level 5, x12 mins seat 7      Lumbar Exercises: Standing   Scapular Retraction  Strengthening;20 reps    Theraband Level (Scapular Retraction)  Level 2 (Red)    Shoulder Extension  Strengthening;20 reps    Theraband Level (Shoulder Extension)  Level 2 (Red)    Other Standing Lumbar Exercises  Standing horizontal ABD x20 with red theraband      Lumbar Exercises:  Supine   Dead Bug  15 reps    Dead Bug Limitations  draw in with each rep, Alternating LE/UE    Bridge with Harley-DavidsonBall Squeeze  20 reps;2 seconds with knee extension at top of bridge      Knee/Hip Exercises: Standing   Forward Step Up  Both;2 sets;10 reps;Hand Hold: 0;Step Height: 6"      Modalities   Modalities  Primary school teacherlectrical Stimulation      Electrical Stimulation   Electrical Stimulation Location  left Mid back    Electrical Stimulation Action  Pre-mod    Electrical Stimulation Parameters  80-150 hz x10    Electrical Stimulation Goals  Pain             PT Education - 06/13/17 1133    Education provided  Yes    Education Details  HEP, rows, ext, horizontal abd , draw in    Person(s) Educated  Patient    Methods  Explanation;Demonstration;Handout    Comprehension  Verbalized understanding;Returned demonstration          PT Long Term Goals - 05/11/17 1119      PT LONG TERM GOAL #1   Title  Independent with a HEP.    Time  6    Period  Weeks    Status  Achieved      PT LONG TERM GOAL #2   Title  Full right knee extension.    Time  6    Period  Weeks    Status  On-going      PT LONG TERM GOAL #3   Title  Perform a reciprocating stair gait with pain not > 2/10.    Time  6    Period  Weeks    Status  On-going      PT LONG TERM GOAL #4   Title  Stand 20 minutes with pain not > 2-3/10.    Time  6    Period  Weeks    Status  On-going      PT LONG TERM GOAL #5   Title  Sit 30 minutes with pain not > 2-3/10.    Time  6    Period  Weeks    Status  On-going      PT LONG TERM GOAL #6   Title  Perform ADL's with pain not > 3/10.    Time  6    Period  Weeks    Status  On-going            Plan - 06/13/17 1236    Clinical Impression Statement  Patient was able to complete exercises with minimal cuing for form and technique. Patient demonstrated good form with elbow planks however patient reported "feeling it in the low back where her herniated disc is."  Exercise terminated to prevent increase of pain. Patient provided with Red theraband and HEP to continue upper/mid back strengthening exercises. Pt reported understanding. No adverse affects found upon removal of modalities.    Rehab Potential  Good    Clinical Impairments Affecting Rehab Potential  High pain-level.    PT Frequency  2x / week    PT Duration  6 weeks    PT Treatment/Interventions  ADLs/Self Care Home Management;Cryotherapy;Electrical Stimulation;Ultrasound;Moist Heat;Gait training;Stair training;Functional mobility training;Therapeutic activities;Therapeutic exercise;Patient/family education;Neuromuscular re-education;Manual techniques;Passive range of motion;Dry needling;Vasopneumatic Device    PT Next Visit Plan  Continue plan of care to improve strength for functional activities. Focus on core stabilization    PT Home Exercise Plan  rows with band, shoulder extension with band, horizontal abd with band.    Consulted and Agree with Plan of Care  Patient       Patient will benefit from skilled therapeutic intervention in order to improve the following deficits and impairments:  Decreased activity tolerance, Abnormal gait, Decreased range of motion, Increased muscle spasms, Postural dysfunction, Pain  Visit Diagnosis: Acute bilateral low back pain, with sciatica presence unspecified  Stiffness of right knee, not elsewhere classified  Acute pain of right knee     Problem List Patient Active Problem List   Diagnosis Date Noted  . Tobacco use disorder 02/22/2017  . History of hysterectomy 02/22/2017  . Allergic rhinitis due to allergen 02/22/2017  . Chronic fatigue 02/22/2017   [name], PT, DPT 06/13/2017, 12:52 PM  [facility] [address] [place], [place], [address] Phone: [phone]   Fax:  [phone]  Name: Sara [name] MRN: [id number] Date of Birth: 1961/01/11

## 2017-06-13 NOTE — Patient Instructions (Signed)
  Bobie Kistler, PT, DPT  

## 2017-06-15 ENCOUNTER — Ambulatory Visit: Payer: Worker's Compensation | Admitting: Physical Therapy

## 2017-06-15 DIAGNOSIS — M25661 Stiffness of right knee, not elsewhere classified: Secondary | ICD-10-CM

## 2017-06-15 DIAGNOSIS — M545 Low back pain: Secondary | ICD-10-CM | POA: Diagnosis not present

## 2017-06-15 DIAGNOSIS — M25561 Pain in right knee: Secondary | ICD-10-CM

## 2017-06-15 NOTE — Therapy (Signed)
[name] [address] [name], [name], [address] Phone: [phone]   Fax:  [phone]  Physical Therapy Treatment  Patient Details  Name: Sara FisherLisa D. Mariann [name] MRN: [id number] Date of Birth: 1960-08-28 Referring Provider: [name]   Encounter Date: 06/15/2017  PT End of Session - 06/15/17 1354    Visit Number  17    Number of Visits  36    Date for PT Re-Evaluation  08/04/17    PT Start Time  1346    PT Stop Time  1433    PT Time Calculation (min)  47 min    Activity Tolerance  Patient tolerated treatment well    Behavior During Therapy  [facility] for tasks assessed/performed       Past Medical History:  Diagnosis Date  . Arthritis     Past Surgical History:  Procedure Laterality Date  . ABDOMINAL HYSTERECTOMY  1986  . FRACTURE SURGERY  1979   fx collar bone    There were no vitals filed for this visit.  Subjective Assessment - 06/15/17 1351    Subjective  Patient went for MRI this morning. Patient reported she will not see Dr. Jerl Santosalldorf until February 11th for results. Patient reported her back hurts along the bra strap and attributes the pain to lying down during her MRI. Patient unable to provide pain scale, patient reports feels " she's at 25%"    Pertinent History  Patient reports a low back injury many years ago but she was experiencing no pain.    Limitations  Standing;Sitting                      [facility] Adult PT Treatment/Exercise - 06/15/17 0001      Exercises   Exercises  Lumbar;Knee/Hip      Lumbar Exercises: Stretches   Other Lumbar Stretch Exercise  3 way forward flexion stretch 3x30" each       Lumbar Exercises: Aerobic   Nustep  level 6 x15       Lumbar Exercises: Standing   Other Standing Lumbar Exercises  push ups on counter x10      Lumbar Exercises: Seated   Other Seated Lumbar Exercises  Bilateral D2 flexion with emphasis of scap depression and retraction x20 1#      Lumbar  Exercises: Supine   Bridge  20 reps      Modalities   Modalities  Geologist, engineeringlectrical Stimulation      Electrical Stimulation   Electrical Stimulation Location  MId back    Electrical Stimulation Action  Pre-Mod    Electrical Stimulation Parameters  80-150 Hz x10    Electrical Stimulation Goals  Pain                  PT Long Term Goals - 05/11/17 1119      PT LONG TERM GOAL #1   Title  Independent with a HEP.    Time  6    Period  Weeks    Status  Achieved      PT LONG TERM GOAL #2   Title  Full right knee extension.    Time  6    Period  Weeks    Status  On-going      PT LONG TERM GOAL #3   Title  Perform a reciprocating stair gait with pain not > 2/10.    Time  6    Period  Weeks    Status  On-going  PT LONG TERM GOAL #4   Title  Stand 20 minutes with pain not > 2-3/10.    Time  6    Period  Weeks    Status  On-going      PT LONG TERM GOAL #5   Title  Sit 30 minutes with pain not > 2-3/10.    Time  6    Period  Weeks    Status  On-going      PT LONG TERM GOAL #6   Title  Perform ADL's with pain not > 3/10.    Time  6    Period  Weeks    Status  On-going            Plan - 06/15/17 1427    Clinical Impression Statement  Due to increased pain and discomfort in mid back, patient was provided with multiple rest breaks between exercises. Patient stated the scapular retractions "capital D" exercises feel good and are helping. Patient instructed she can perform exercises throughout the day to improve posture and mid thoracic pain. Patient was able to complete exercises with minimal verbal cuing for form demonstrating good carryover of cuing from last visit. Patient did not have any adverse affects upon removal of e-stim.    Clinical Presentation  Evolving    Clinical Decision Making  Moderate    Rehab Potential  Good    Clinical Impairments Affecting Rehab Potential  High pain-level.    PT Frequency  2x / week    PT Duration  6 weeks    PT  Treatment/Interventions  ADLs/Self Care Home Management;Cryotherapy;Electrical Stimulation;Ultrasound;Moist Heat;Gait training;Stair training;Functional mobility training;Therapeutic activities;Therapeutic exercise;Patient/family education;Neuromuscular re-education;Manual techniques;Passive range of motion;Dry needling;Vasopneumatic Device    PT Next Visit Plan  Continue plan of care to improve strength for functional activities. Focus on core stabilization    Consulted and Agree with Plan of Care  Patient       Patient will benefit from skilled therapeutic intervention in order to improve the following deficits and impairments:  Decreased activity tolerance, Abnormal gait, Decreased range of motion, Increased muscle spasms, Postural dysfunction, Pain  Visit Diagnosis: Acute bilateral low back pain, with sciatica presence unspecified  Stiffness of right knee, not elsewhere classified  Acute pain of right knee     Problem List Patient Active Problem List   Diagnosis Date Noted  . Tobacco use disorder 02/22/2017  . History of hysterectomy 02/22/2017  . Allergic rhinitis due to allergen 02/22/2017  . Chronic fatigue 02/22/2017   [name], PT, DPT 06/15/2017, 2:53 PM  [name] [address] [place], [place], [address] Phone: [phone]   Fax:  [phone]  Name: Sara [name] MRN: [id number] Date of Birth: 1960/06/12

## 2017-06-16 ENCOUNTER — Ambulatory Visit: Payer: BLUE CROSS/BLUE SHIELD | Admitting: Family Medicine

## 2017-06-19 ENCOUNTER — Encounter: Payer: Self-pay | Admitting: Family Medicine

## 2017-06-19 ENCOUNTER — Ambulatory Visit: Payer: Worker's Compensation | Attending: Orthopaedic Surgery | Admitting: Physical Therapy

## 2017-06-19 DIAGNOSIS — M25661 Stiffness of right knee, not elsewhere classified: Secondary | ICD-10-CM

## 2017-06-19 DIAGNOSIS — M545 Low back pain: Secondary | ICD-10-CM | POA: Insufficient documentation

## 2017-06-19 DIAGNOSIS — M25561 Pain in right knee: Secondary | ICD-10-CM | POA: Insufficient documentation

## 2017-06-19 NOTE — Therapy (Signed)
[name] [address] [place], [place], [address] Phone: [phone]   Fax:  [phone]  Physical Therapy Treatment  Patient Details  Name: Sara [name] MRN: [id number] Date of Birth: Aug 25, 1960 Referring Provider: Marcene Corning MD   Encounter Date: 06/19/2017  PT End of Session - 06/19/17 1101    Visit Number  18    Number of Visits  36    Date for PT Re-Evaluation  08/04/17    PT Start Time  1030    PT Stop Time  1123    PT Time Calculation (min)  53 min    Activity Tolerance  Patient tolerated treatment well    Behavior During Therapy  [facility] for tasks assessed/performed       Past Medical History:  Diagnosis Date  . Arthritis     Past Surgical History:  Procedure Laterality Date  . ABDOMINAL HYSTERECTOMY  1986  . FRACTURE SURGERY  1979   fx collar bone    There were no vitals filed for this visit.  Subjective Assessment - 06/19/17 1116    Subjective  Patient reported increased back pain, "40% function". Patient stated she did not do anything significant to increase pain but later reported she mopped her floors in which she reported moderate difficulty.                      [facility] Adult PT Treatment/Exercise - 06/19/17 0001      Exercises   Exercises  Knee/Hip;Lumbar      Lumbar Exercises: Stretches   Quadruped Mid Back Stretch  30 seconds;3 reps      Lumbar Exercises: Aerobic   Nustep  level 6 x15 minutes      Lumbar Exercises: Standing   Other Standing Lumbar Exercises  External rotation with emphasis of scapular retraction 2x10 green TB      Lumbar Exercises: Quadruped   Madcat/Old Horse  15 reps Mad cat only    Single Arm Raise  Right;Left;20 reps;2 seconds With draw in      Modalities   Modalities  Electrical Stimulation      Moist Heat Therapy   Number Minutes Moist Heat  10 Minutes    Moist Heat Location  -- Thoracic spine      Electrical Stimulation   Electrical Stimulation  Location  mid back    Electrical Stimulation Action  pre-mod    Electrical Stimulation Parameters  80-150 Hz x10    Electrical Stimulation Goals  Pain      Manual Therapy   Manual Therapy  Soft tissue mobilization    Manual therapy comments  STW/M to mid thoracic to decrease pain and tone. x8 minutes with biofreeze after estim and moist heat                  PT Long Term Goals - 05/11/17 1119      PT LONG TERM GOAL #1   Title  Independent with a HEP.    Time  6    Period  Weeks    Status  Achieved      PT LONG TERM GOAL #2   Title  Full right knee extension.    Time  6    Period  Weeks    Status  On-going      PT LONG TERM GOAL #3   Title  Perform a reciprocating stair gait with pain not > 2/10.    Time  6  Period  Weeks    Status  On-going      PT LONG TERM GOAL #4   Title  Stand 20 minutes with pain not > 2-3/10.    Time  6    Period  Weeks    Status  On-going      PT LONG TERM GOAL #5   Title  Sit 30 minutes with pain not > 2-3/10.    Time  6    Period  Weeks    Status  On-going      PT LONG TERM GOAL #6   Title  Perform ADL's with pain not > 3/10.    Time  6    Period  Weeks    Status  On-going            Plan - 06/19/17 1113    Clinical Impression Statement  Exercises were completed with no increased soreness in the thoracic spine. T-spine and scapular muscular assessed, no trigger points noted. Patient educated on importance of learning limits of activities to prevent pain. Patient instructed to perform household duties in sections with adequate rest breaks in between. Patient reported understanding. No adverse affects to E-Stim.    Clinical Presentation  Evolving    Clinical Decision Making  Moderate    Rehab Potential  Good    Clinical Impairments Affecting Rehab Potential  High pain-level.    PT Frequency  2x / week    PT Duration  6 weeks    PT Treatment/Interventions  ADLs/Self Care Home Management;Cryotherapy;Electrical  Stimulation;Ultrasound;Moist Heat;Gait training;Stair training;Functional mobility training;Therapeutic activities;Therapeutic exercise;Patient/family education;Neuromuscular re-education;Manual techniques;Passive range of motion;Dry needling;Vasopneumatic Device    PT Next Visit Plan  Continue plan of care to decrease pain and improve functioal mobility.    Consulted and Agree with Plan of Care  Patient       Patient will benefit from skilled therapeutic intervention in order to improve the following deficits and impairments:  Decreased activity tolerance, Abnormal gait, Decreased range of motion, Increased muscle spasms, Postural dysfunction, Pain  Visit Diagnosis: Acute bilateral low back pain, with sciatica presence unspecified  Stiffness of right knee, not elsewhere classified  Acute pain of right knee     Problem List Patient Active Problem List   Diagnosis Date Noted  . Tobacco use disorder 02/22/2017  . History of hysterectomy 02/22/2017  . Allergic rhinitis due to allergen 02/22/2017  . Chronic fatigue 02/22/2017   Guss BundeKrystle Catriona Dillenbeck, PT, DPT  06/19/2017, 11:41 AM  [name] [address] [place], [name], [address] Phone: [phone]   Fax:  [phone]  Name: Sara [name] MRN: [id number] Date of Birth: 1961/01/20

## 2017-06-21 ENCOUNTER — Ambulatory Visit: Payer: Worker's Compensation | Admitting: Physical Therapy

## 2017-06-23 ENCOUNTER — Ambulatory Visit: Payer: Worker's Compensation | Admitting: Physical Therapy

## 2017-06-23 DIAGNOSIS — M545 Low back pain: Secondary | ICD-10-CM

## 2017-06-23 DIAGNOSIS — M25561 Pain in right knee: Secondary | ICD-10-CM

## 2017-06-23 DIAGNOSIS — M25661 Stiffness of right knee, not elsewhere classified: Secondary | ICD-10-CM

## 2017-06-23 NOTE — Therapy (Signed)
[name] [address] [place], [name], [address] Phone: [phone]   Fax:  [phone]  Physical Therapy Treatment  Patient Details  Name: Sara FisherLisa D. Sara [name] MRN: [id number] Date of Birth: 29-Oct-1960 Referring Provider: [name]   Encounter Date: 06/23/2017  PT End of Session - 06/23/17 1032    Visit Number  19    Number of Visits  36    Date for PT Re-Evaluation  08/04/17    PT Start Time  1030    PT Stop Time  1116    PT Time Calculation (min)  46 min    Activity Tolerance  Patient tolerated treatment well    Behavior During Therapy  [facility] for tasks assessed/performed       Past Medical History:  Diagnosis Date  . Arthritis     Past Surgical History:  Procedure Laterality Date  . ABDOMINAL HYSTERECTOMY  1986  . FRACTURE SURGERY  1979   fx collar bone    There were no vitals filed for this visit.  Subjective Assessment - 06/23/17 1032    Subjective  Patient reported feeling good but "I'm always going to be in pain." Patient reported 50% function. Patient to see Dr. Fara Chutealdorff on Monday to review MRI results.    Patient Stated Goals  Get out of pain and back to work.                      [facility] Treatment/Exercise - 06/23/17 0001      Exercises   Exercises  Knee/Hip;Lumbar      Lumbar Exercises: Stretches   Other Lumbar Stretch Exercise  forward flexion stretch on counter top 3x30"      Lumbar Exercises: Aerobic   Nustep  level 6 x15 minutes      Lumbar Exercises: Standing   Other Standing Lumbar Exercises  Push ups on counter      Lumbar Exercises: Supine   Other Supine Lumbar Exercises  90/90 heel taps x2 minutes    Other Supine Lumbar Exercises  Shoulder rolls 2x10      Lumbar Exercises: Sidelying   Clam  --    Clam Limitations  --      Lumbar Exercises: Quadruped   Madcat/Old Horse  10 reps    Plank  --      Modalities   Modalities  Electrical Stimulation;Cryotherapy       Cryotherapy   Number Minutes Cryotherapy  10 Minutes    Cryotherapy Location  -- mid back    Type of Cryotherapy  Ice pack      Electrical Stimulation   Electrical Stimulation Location  mid back    Electrical Stimulation Action  Pre-mod    Electrical Stimulation Parameters  80-150 Hz x10 minutes    Electrical Stimulation Goals  Pain                  PT Long Term Goals - 05/11/17 1119      PT LONG TERM GOAL #1   Title  Independent with a HEP.    Time  6    Period  Weeks    Status  Achieved      PT LONG TERM GOAL #2   Title  Full right knee extension.    Time  6    Period  Weeks    Status  On-going      PT LONG TERM GOAL #3   Title  Perform a  reciprocating stair gait with pain not > 2/10.    Time  6    Period  Weeks    Status  On-going      PT LONG TERM GOAL #4   Title  Stand 20 minutes with pain not > 2-3/10.    Time  6    Period  Weeks    Status  On-going      PT LONG TERM GOAL #5   Title  Sit 30 minutes with pain not > 2-3/10.    Time  6    Period  Weeks    Status  On-going      PT LONG TERM GOAL #6   Title  Perform ADL's with pain not > 3/10.    Time  6    Period  Weeks    Status  On-going            Plan - 06/23/17 1047    Clinical Impression Statement  Patient able to complete exercises despite "feeling a pulling in low back were my buldging disc is" during 90/90 heel taps. Patient required multiple cuing for flat back and core stabilization for 90/90 heel taps. Patient instructed to rest throughout exercise. Patient educated the importance of proper posture to alleviate pain and to allow proper mobility of the shoulder. Patient reported understanding. Patient tried ice pack for pain relief instead of heat. Patient stated it felt fine. No adverse affects noted upon removal of modalities.     Clinical Presentation  Evolving    Clinical Decision Making  Moderate    Rehab Potential  Good    Clinical Impairments Affecting Rehab Potential   High pain-level.    PT Frequency  2x / week    PT Duration  6 weeks    PT Treatment/Interventions  ADLs/Self Care Home Management;Cryotherapy;Electrical Stimulation;Ultrasound;Moist Heat;Gait training;Stair training;Functional mobility training;Therapeutic activities;Therapeutic exercise;Patient/family education;Neuromuscular re-education;Manual techniques;Passive range of motion;Dry needling;Vasopneumatic Device    PT Next Visit Plan  Try using BP cuff for visual for supine core stabilization exercises. Continue plan of care to decrease pain and improve functioal mobility.    PT Home Exercise Plan  rows with band, shoulder extension with band, horizontal abd with band.    Consulted and Agree with Plan of Care  Patient       Patient will benefit from skilled therapeutic intervention in order to improve the following deficits and impairments:  Decreased activity tolerance, Abnormal gait, Decreased range of motion, Increased muscle spasms, Postural dysfunction, Pain  Visit Diagnosis: Acute bilateral low back pain, with sciatica presence unspecified  Stiffness of right knee, not elsewhere classified  Acute pain of right knee     Problem List Patient Active Problem List   Diagnosis Date Noted  . Tobacco use disorder 02/22/2017  . History of hysterectomy 02/22/2017  . Allergic rhinitis due to allergen 02/22/2017  . Chronic fatigue 02/22/2017   Sara [name], PT, DPT 06/23/2017, 12:21 PM  [name] [address] [place], [place], [address] Phone: [phone]   Fax:  [phone]  Name: Sara Friedt. Giannetti MRN: [id number] Date of Birth: 1960/06/30

## 2017-06-26 ENCOUNTER — Ambulatory Visit: Payer: Self-pay | Admitting: Family Medicine

## 2017-06-26 ENCOUNTER — Ambulatory Visit: Payer: BLUE CROSS/BLUE SHIELD | Admitting: Family Medicine

## 2017-06-27 ENCOUNTER — Ambulatory Visit: Payer: Worker's Compensation | Admitting: Physical Therapy

## 2017-06-27 DIAGNOSIS — M25561 Pain in right knee: Secondary | ICD-10-CM

## 2017-06-27 DIAGNOSIS — M545 Low back pain: Secondary | ICD-10-CM | POA: Diagnosis not present

## 2017-06-27 DIAGNOSIS — M25661 Stiffness of right knee, not elsewhere classified: Secondary | ICD-10-CM

## 2017-06-27 NOTE — Therapy (Addendum)
[name] [address] [place], [place], [address] Phone: [phone]   Fax:  [phone]  Physical Therapy Treatment  Patient Details  Name: Sara [name] MRN: [id number] Date of Birth: 1961/02/28 Referring Provider: Marcene Corning MD   Encounter Date: 06/27/2017  PT End of Session - 06/27/17 1137    Visit Number  20    Number of Visits  36    Date for PT Re-Evaluation  08/04/17    PT Start Time  1116    PT Stop Time  1205    PT Time Calculation (min)  49 min    Activity Tolerance  Patient tolerated treatment well    Behavior During Therapy  [facility] for tasks assessed/performed       Past Medical History:  Diagnosis Date  . Arthritis     Past Surgical History:  Procedure Laterality Date  . ABDOMINAL HYSTERECTOMY  1986  . FRACTURE SURGERY  1979   fx collar bone    There were no vitals filed for this visit.  Subjective Assessment - 06/27/17 1140    Subjective  Patient stated she feels "55% function in the back and 30% function in knee." Knee hurts more today than yesterday.    Pertinent History  Patient reports a low back injury many years ago but she was experiencing no pain.    Limitations  Standing;Sitting    Diagnostic tests  X-ray and MRI.    Patient Stated Goals  Get out of pain and back to work.                      [facility] Adult PT Treatment/Exercise - 06/27/17 0001      Lumbar Exercises: Stretches   Passive Hamstring Stretch  Right;20 seconds;4 reps with strap    Quad Stretch  2 reps;Right attempted, Pain in knee with knee flexion      Lumbar Exercises: Aerobic   Nustep  level 6 x12 minutes      Lumbar Exercises: Higher education careers adviser;Both x30 with draw in Pink XTS    Other Standing Lumbar Exercises  Standing chest press with Pink XTS x20      Knee/Hip Exercises: Supine   Short Arc Quad Sets  Strengthening;Right;Both;2 sets;10 reps          Addendum: Electrical stimulation to R  knee, Pre-mod 80-150 hz x 10 minutes with moist heat pack for pain relief.        PT Long Term Goals - 05/11/17 1119      PT LONG TERM GOAL #1   Title  Independent with a HEP.    Time  6    Period  Weeks    Status  Achieved      PT LONG TERM GOAL #2   Title  Full right knee extension.    Time  6    Period  Weeks    Status  On-going      PT LONG TERM GOAL #3   Title  Perform a reciprocating stair gait with pain not > 2/10.    Time  6    Period  Weeks    Status  On-going      PT LONG TERM GOAL #4   Title  Stand 20 minutes with pain not > 2-3/10.    Time  6    Period  Weeks    Status  On-going      PT LONG TERM GOAL #5  Title  Sit 30 minutes with pain not > 2-3/10.    Time  6    Period  Weeks    Status  On-going      PT LONG TERM GOAL #6   Title  Perform ADL's with pain not > 3/10.    Time  6    Period  Weeks    Status  On-going            Plan - 06/27/17 1201    Clinical Impression Statement  Patient was able to complete low back stabilization exerises without any increase of pain, just "burning like the muscles are working" in the back. Patient instructed to rest as needed. Patient and PT discussed plan of care for knee pain with new dx of Grade IV chondromalacia; pain free AROM, hamstring stretching and pain free strengthening. Patient in agreement. Patient noted with no adverse affects upon removal of modalities.    Clinical Presentation  Evolving    Clinical Decision Making  Moderate    Rehab Potential  Good    Clinical Impairments Affecting Rehab Potential  High pain-level.    PT Frequency  2x / week    PT Duration  6 weeks    PT Treatment/Interventions  ADLs/Self Care Home Management;Cryotherapy;Electrical Stimulation;Ultrasound;Moist Heat;Gait training;Stair training;Functional mobility training;Therapeutic activities;Therapeutic exercise;Patient/family education;Neuromuscular re-education;Manual techniques;Passive range of motion;Dry  needling;Vasopneumatic Device    PT Next Visit Plan  Try using BP cuff for visual for supine core stabilization exercises. Continue plan of care to decrease pain and improve functioal mobility.    Consulted and Agree with Plan of Care  Patient       Patient will benefit from skilled therapeutic intervention in order to improve the following deficits and impairments:  Decreased activity tolerance, Abnormal gait, Decreased range of motion, Increased muscle spasms, Postural dysfunction, Pain  Visit Diagnosis: Acute bilateral low back pain, with sciatica presence unspecified  Stiffness of right knee, not elsewhere classified  Acute pain of right knee     Problem List Patient Active Problem List   Diagnosis Date Noted  . Tobacco use disorder 02/22/2017  . History of hysterectomy 02/22/2017  . Allergic rhinitis due to allergen 02/22/2017  . Chronic fatigue 02/22/2017   [name], PT, DPT 06/27/2017, 12:21 PM  [name] [address] [place], [name], [address] Phone: [phone]   Fax:  [phone]  Name: Sara FisherLisa D. Mariann [name] MRN: [id number] Date of Birth: Feb 10, 1961

## 2017-06-28 ENCOUNTER — Ambulatory Visit: Payer: Worker's Compensation | Admitting: Physical Therapy

## 2017-06-28 DIAGNOSIS — M545 Low back pain: Secondary | ICD-10-CM | POA: Diagnosis not present

## 2017-06-28 DIAGNOSIS — M25661 Stiffness of right knee, not elsewhere classified: Secondary | ICD-10-CM

## 2017-06-28 DIAGNOSIS — M25561 Pain in right knee: Secondary | ICD-10-CM

## 2017-06-28 NOTE — Therapy (Addendum)
[name] [address] [place], [place], [address] Phone: [phone]   Fax:  [phone]  Physical Therapy Treatment  Patient Details  Name: Sara [name] MRN: [id number] Date of Birth: 08-18-1960 Referring Provider: Marcene Corning MD   Encounter Date: 06/28/2017  PT End of Session - 06/28/17 0829    Visit Number  21    Number of Visits  36    Date for PT Re-Evaluation  08/04/17    PT Start Time  0816    PT Stop Time  0903    PT Time Calculation (min)  47 min    Activity Tolerance  Patient tolerated treatment well    Behavior During Therapy  [facility] for tasks assessed/performed       Past Medical History:  Diagnosis Date  . Arthritis     Past Surgical History:  Procedure Laterality Date  . ABDOMINAL HYSTERECTOMY  1986  . FRACTURE SURGERY  1979   fx collar bone    There were no vitals filed for this visit.  Subjective Assessment - 06/28/17 0855    Subjective  Patient reports "i feel alright".    Pertinent History  Patient reports a low back injury many years ago but she was experiencing no pain.    Limitations  Standing;Sitting    Diagnostic tests  X-ray and MRI.    Patient Stated Goals  Get out of pain and back to work.                      [facility] Adult PT Treatment/Exercise - 06/28/17 0001      Lumbar Exercises: Aerobic   Nustep  level 6 x15 minutes      Knee/Hip Exercises: Supine   Short Arc Quad Sets  Strengthening;2 sets;10 reps;Right    Bridges  Strengthening;Both;3 sets;10 reps    Straight Leg Raises  Strengthening;2 sets;10 reps    Other Supine Knee/Hip Exercises  supine Hip abduction with Red band 2x15      Knee/Hip Exercises: Sidelying   Hip ABduction  Strengthening;Both;1 set;20 reps    Clams  2x10, Red      Modalities   Modalities  Electrical Stimulation      Moist Heat Therapy   Number Minutes Moist Heat  10 Minutes    Moist Heat Location  Knee      Electrical Stimulation   Electrical Stimulation Location  knee    Electrical Stimulation Action  Pre-mod    Electrical Stimulation Parameters  80-150 Hz x10    Electrical Stimulation Goals  Pain                  PT Long Term Goals - 05/11/17 1119      PT LONG TERM GOAL #1   Title  Independent with a HEP.    Time  6    Period  Weeks    Status  Achieved      PT LONG TERM GOAL #2   Title  Full right knee extension.    Time  6    Period  Weeks    Status  On-going      PT LONG TERM GOAL #3   Title  Perform a reciprocating stair gait with pain not > 2/10.    Time  6    Period  Weeks    Status  On-going      PT LONG TERM GOAL #4   Title  Stand 20 minutes with pain not >  2-3/10.    Time  6    Period  Weeks    Status  On-going      PT LONG TERM GOAL #5   Title  Sit 30 minutes with pain not > 2-3/10.    Time  6    Period  Weeks    Status  On-going      PT LONG TERM GOAL #6   Title  Perform ADL's with pain not > 3/10.    Time  6    Period  Weeks    Status  On-going            Plan - 06/28/17 [id number]    Clinical Impression Statement  Patient was able to complete exercises with no increases of pain or discomfort in R knee or back. Exercises focused on hip/knee stabilization and strengthening. Patient required multiple cuing to maintain a straight knee during straight leg raise exercise and sidelying hip abduction exercise. Patient requested to focus on back exercises next visit.    Clinical Presentation  Evolving    Clinical Decision Making  Moderate    Rehab Potential  Good    Clinical Impairments Affecting Rehab Potential  High pain-level.    PT Frequency  2x / week    PT Duration  6 weeks    PT Treatment/Interventions  ADLs/Self Care Home Management;Cryotherapy;Electrical Stimulation;Ultrasound;Moist Heat;Gait training;Stair training;Functional mobility training;Therapeutic activities;Therapeutic exercise;Patient/family education;Neuromuscular re-education;Manual techniques;Passive  range of motion;Dry needling;Vasopneumatic Device    PT Next Visit Plan  Continue POC to address goals    Consulted and Agree with Plan of Care  Patient       Patient will benefit from skilled therapeutic intervention in order to improve the following deficits and impairments:  Decreased activity tolerance, Abnormal gait, Decreased range of motion, Increased muscle spasms, Postural dysfunction, Pain  Visit Diagnosis: Acute bilateral low back pain, with sciatica presence unspecified  Acute pain of right knee  Stiffness of right knee, not elsewhere classified     Problem List Patient Active Problem List   Diagnosis Date Noted  . Tobacco use disorder 02/22/2017  . History of hysterectomy 02/22/2017  . Allergic rhinitis due to allergen 02/22/2017  . Chronic fatigue 02/22/2017   Guss BundeKrystle Danella Philson, PT, DPT 06/28/2017, 9:07 AM  [name] [address] [place], [name], [address] Phone: [phone]   Fax:  [phone]  Name: Sara [name] MRN: [id number] Date of Birth: [birth year]

## 2017-06-29 ENCOUNTER — Ambulatory Visit: Payer: Worker's Compensation | Admitting: Physical Therapy

## 2017-06-29 DIAGNOSIS — M545 Low back pain: Secondary | ICD-10-CM

## 2017-06-29 DIAGNOSIS — M25661 Stiffness of right knee, not elsewhere classified: Secondary | ICD-10-CM

## 2017-06-29 DIAGNOSIS — M25561 Pain in right knee: Secondary | ICD-10-CM

## 2017-06-29 NOTE — Therapy (Signed)
[name] [address] [name], [name], [address] Phone: [phone]   Fax:  [phone]  Physical Therapy Treatment  Patient Details  Name: Sara FisherLisa D. Mariann [name] MRN: [id number] Date of Birth: 1961-03-19 Referring Provider: [name]   Encounter Date: 06/29/2017  PT End of Session - 06/29/17 1123    Visit Number  22    Number of Visits  36    Date for PT Re-Evaluation  08/04/17    PT Start Time  1116    Activity Tolerance  Patient tolerated treatment well    Behavior During Therapy  [facility] assessed/performed       Past Medical History:  Diagnosis Date  . Arthritis     Past Surgical History:  Procedure Laterality Date  . ABDOMINAL HYSTERECTOMY  1986  . FRACTURE SURGERY  1979   fx collar bone    There were no vitals filed for this visit.  Subjective Assessment - 06/29/17 1118    Subjective  Patient reports "I feel a little sore in the knee since yesterday. I feel a sharp constant pain in the right side of my knee." "I'm about 55%"    Pertinent History  Patient reports a low back injury many years ago but she was experiencing no pain.    Limitations  Standing;Sitting    Diagnostic tests  X-ray and MRI.    Patient Stated Goals  Get out of pain and back to work.                      [facility] Adult PT Treatment/Exercise - 06/29/17 0001      Lumbar Exercises: Aerobic   Nustep  level 6 x15 minutes      Lumbar Exercises: Standing   Row  Strengthening;Both;20 reps Orange XTS    Shoulder Extension  Strengthening;Both;20 reps Orange XTS    Other Standing Lumbar Exercises  Push ups on counter top 2x10      Lumbar Exercises: Supine   Pelvic Tilt  20 reps with biofeedback    Bent Knee Raise  20 reps  with emphasis on pelvis tilt      Modalities   Modalities  Electrical Stimulation      Electrical Stimulation   Electrical Stimulation Location  left mid back and knee    Electrical Stimulation Action   Pre-mod    Electrical Stimulation Parameters  80-150 hz x10    Electrical Stimulation Goals  Pain                  PT Long Term Goals - 05/11/17 1119      PT LONG TERM GOAL #1   Title  Independent with a HEP.    Time  6    Period  Weeks    Status  Achieved      PT LONG TERM GOAL #2   Title  Full right knee extension.    Time  6    Period  Weeks    Status  On-going      PT LONG TERM GOAL #3   Title  Perform a reciprocating stair gait with pain not > 2/10.    Time  6    Period  Weeks    Status  On-going      PT LONG TERM GOAL #4   Title  Stand 20 minutes with pain not > 2-3/10.    Time  6    Period  Weeks  Status  On-going      PT LONG TERM GOAL #5   Title  Sit 30 minutes with pain not > 2-3/10.    Time  6    Period  Weeks    Status  On-going      PT LONG TERM GOAL #6   Title  Perform ADL's with pain not > 3/10.    Time  6    Period  Weeks    Status  On-going            Plan - 06/29/17 1203    Clinical Impression Statement  Patient continues to require cuing for scapular retractions. Patient stated horizontal abduction exercises "really feel good". Patient educated again on importance performing HEP of postural exercises to strengthen. Pt agreed. No adverse affects noted upon removal of modalities.     Clinical Presentation  Evolving    Clinical Decision Making  Moderate    Rehab Potential  Good    Clinical Impairments Affecting Rehab Potential  High pain-level.    PT Frequency  2x / week    PT Duration  6 weeks    PT Treatment/Interventions  ADLs/Self Care Home Management;Cryotherapy;Electrical Stimulation;Ultrasound;Moist Heat;Gait training;Stair training;Functional mobility training;Therapeutic activities;Therapeutic exercise;Patient/family education;Neuromuscular re-education;Manual techniques;Passive range of motion;Dry needling;Vasopneumatic Device    PT Next Visit Plan  Continue POC to address goals    PT Home Exercise Plan  rows with  band, shoulder extension with band, horizontal abd with band.    Consulted and Agree with Plan of Care  Patient       Patient will benefit from skilled therapeutic intervention in order to improve the following deficits and impairments:  Decreased activity tolerance, Abnormal gait, Decreased range of motion, Increased muscle spasms, Postural dysfunction, Pain  Visit Diagnosis: Acute bilateral low back pain, with sciatica presence unspecified  Acute pain of right knee  Stiffness of right knee, not elsewhere classified     Problem List Patient Active Problem List   Diagnosis Date Noted  . Tobacco use disorder 02/22/2017  . History of hysterectomy 02/22/2017  . Allergic rhinitis due to allergen 02/22/2017  . Chronic fatigue 02/22/2017   [name], PT, DPT 06/29/2017, 12:18 PM  [name] [address] [place], [place], [address] Phone: [phone]   Fax:  [phone]  Name: Sara [name] MRN: [id number] Date of Birth: 1961-05-12

## 2017-07-04 ENCOUNTER — Ambulatory Visit: Payer: Worker's Compensation | Admitting: Physical Therapy

## 2017-07-04 DIAGNOSIS — M545 Low back pain: Secondary | ICD-10-CM

## 2017-07-04 DIAGNOSIS — M25561 Pain in right knee: Secondary | ICD-10-CM

## 2017-07-04 DIAGNOSIS — M25661 Stiffness of right knee, not elsewhere classified: Secondary | ICD-10-CM

## 2017-07-04 NOTE — Therapy (Signed)
[name] [address] [place], [place], [address] Phone: [phone]   Fax:  [phone]  Physical Therapy Treatment  Patient Details  Name: Sara [name]. Remmers MRN: [id number] Date of Birth: [birth year] Referring Provider: Marcene Corning MD   Encounter Date: 07/04/2017  PT End of Session - 07/04/17 1117    Visit Number  23    Number of Visits  36    Date for PT Re-Evaluation  08/04/17    PT Start Time  1116    PT Stop Time  1202    PT Time Calculation (min)  46 min    Activity Tolerance  Patient tolerated treatment well    Behavior During Therapy  [facility] for tasks assessed/performed       Past Medical History:  Diagnosis Date  . Arthritis     Past Surgical History:  Procedure Laterality Date  . ABDOMINAL HYSTERECTOMY  1986  . FRACTURE SURGERY  1979   fx collar bone    There were no vitals filed for this visit.  Subjective Assessment - 07/04/17 1130    Subjective  Patient reported feeling good but still sore around the shoulder blades. "55% limited" Patient to see Dr. Fara Chute to discuss knee injections and get more information regarding MRI tomorrow.    Limitations  Standing;Sitting    Diagnostic tests  X-ray and MRI.    Patient Stated Goals  Get out of pain and back to work.                      [facility] Adult PT Treatment/Exercise - 07/04/17 0001      Lumbar Exercises: Stretches   Other Lumbar Stretch Exercise  Corner stretch 4x30"      Lumbar Exercises: Aerobic   Nustep  level 6 x15 minutes      Lumbar Exercises: Standing   Other Standing Lumbar Exercises  Shoulder horizontal abduction, green theraband 3x10 emphasis on draw in      Knee/Hip Exercises: Supine   Short Arc Quad Sets  Strengthening;Right;3 sets;10 reps      Modalities   Modalities  Ultrasound      Ultrasound   Ultrasound Location  mid thoracic paraspinals 1.5 w/cm2  x10 minutes    Ultrasound Parameters  1.5w/cm2, 100% 3.3    Ultrasound Goals  Pain                  PT Long Term Goals - 05/11/17 1119      PT LONG TERM GOAL #1   Title  Independent with a HEP.    Time  6    Period  Weeks    Status  Achieved      PT LONG TERM GOAL #2   Title  Full right knee extension.    Time  6    Period  Weeks    Status  On-going      PT LONG TERM GOAL #3   Title  Perform a reciprocating stair gait with pain not > 2/10.    Time  6    Period  Weeks    Status  On-going      PT LONG TERM GOAL #4   Title  Stand 20 minutes with pain not > 2-3/10.    Time  6    Period  Weeks    Status  On-going      PT LONG TERM GOAL #5   Title  Sit 30 minutes with pain not >  2-3/10.    Time  6    Period  Weeks    Status  On-going      PT LONG TERM GOAL #6   Title  Perform ADL's with pain not > 3/10.    Time  6    Period  Weeks    Status  On-going            Plan - 07/04/17 1207    Clinical Impression Statement  Patient still demonstrates poor posture (rounded shoulders and forward head) despite patient reporting performing HEP for posture daily. Patient educated again on pain free motion during knee strengthening and importance of postura exercises. Patient reported understanding. No adverse affects noted at the end of US session. Patient stated she felt "real good" after ultrasound.    Clinical Presentation  Evolving    Clinical Decision Making  Moderate    Rehab Potential  Good    Clinical Impairments Affecting Rehab Potential  High pain-level.    PT Frequency  2x / week    PT Duration  6 weeks    PT Treatment/Interventions  ADLs/Self Care Home Management;Cryotherapy;Electrical Stimulation;Ultrasound;Moist Heat;Gait training;Stair training;Functional mobility training;Therapeutic activities;Therapeutic exercise;Patient/family education;Neuromuscular re-education;Manual techniques;Passive range of motion;Dry needling;Vasopneumatic Device    PT Next Visit Plan  Continue POC to address goals    Consulted and  Agree with Plan of Care  Patient       Patient will benefit from skilled therapeutic intervention in order to improve the following deficits and impairments:  Decreased activity tolerance, Abnormal gait, Decreased range of motion, Increased muscle spasms, Postural dysfunction, Pain  Visit Diagnosis: Acute bilateral low back pain, with sciatica presence unspecified  Stiffness of right knee, not elsewhere classified  Acute pain of right knee     Problem List Patient Active Problem List   Diagnosis Date Noted  . Tobacco use disorder 02/22/2017  . History of hysterectomy 02/22/2017  . Allergic rhinitis due to allergen 02/22/2017  . Chronic fatigue 02/22/2017   Guss BundeKrystle Isabelle Matt, PT, DPT 07/04/2017, 12:22 PM  [name] [address] [place], [name], [address] Phone: [phone]   Fax:  [phone]  Name: Belinda FisherLisa D. Mariann LasterBowden MRN: [id number] Date of Birth: 07/08/1960

## 2017-07-05 ENCOUNTER — Ambulatory Visit: Payer: Worker's Compensation | Admitting: Physical Therapy

## 2017-07-05 DIAGNOSIS — M25561 Pain in right knee: Secondary | ICD-10-CM

## 2017-07-05 DIAGNOSIS — M25661 Stiffness of right knee, not elsewhere classified: Secondary | ICD-10-CM

## 2017-07-05 DIAGNOSIS — M545 Low back pain: Secondary | ICD-10-CM

## 2017-07-05 NOTE — Therapy (Signed)
[name] [address] [place], [place], [address] Phone: [phone]   Fax:  [phone]  Physical Therapy Treatment  Patient Details  Name: Sara [name]. Dejonge MRN: [id number] Date of Birth: Sep 21, 1960 Referring Provider: Marcene Corning MD   Encounter Date: 07/05/2017  PT End of Session - 07/05/17 1146    Visit Number  24    Number of Visits  36    Date for PT Re-Evaluation  08/04/17    PT Start Time  1115    PT Stop Time  1208    PT Time Calculation (min)  53 min    Activity Tolerance  Patient tolerated treatment well    Behavior During Therapy  [facility] for tasks assessed/performed       Past Medical History:  Diagnosis Date  . Arthritis     Past Surgical History:  Procedure Laterality Date  . ABDOMINAL HYSTERECTOMY  1986  . FRACTURE SURGERY  1979   fx collar bone    There were no vitals filed for this visit.  Subjective Assessment - 07/05/17 1246    Subjective  Patient reported exercises last visit helped. "I feel 60% better"    Pertinent History  Patient reports a low back injury many years ago but she was experiencing no pain.    Limitations  Standing;Sitting    Diagnostic tests  X-ray and MRI.    Patient Stated Goals  Get out of pain and back to work.                      [facility] Adult PT Treatment/Exercise - 07/05/17 0001      Lumbar Exercises: Aerobic   Nustep  level 5 x15 minutes      Lumbar Exercises: Standing   Row  Strengthening;Both x30 greentheraband    Other Standing Lumbar Exercises  Shoulder horizontal abduction, green theraband 3x10      Knee/Hip Exercises: Supine   Short Arc Quad Sets  Strengthening;Both;3 sets;10 reps 1# weight      Knee/Hip Exercises: Sidelying   Hip ABduction  Strengthening;Both;2 sets;10 reps      Modalities   Modalities  Ultrasound      Ultrasound   Ultrasound Location  mid thoracic pain    Ultrasound Parameters  1.5w/cm2, 100%, 3.3    Ultrasound Goals  Pain                   PT Long Term Goals - 05/11/17 1119      PT LONG TERM GOAL #1   Title  Independent with a HEP.    Time  6    Period  Weeks    Status  Achieved      PT LONG TERM GOAL #2   Title  Full right knee extension.    Time  6    Period  Weeks    Status  On-going      PT LONG TERM GOAL #3   Title  Perform a reciprocating stair gait with pain not > 2/10.    Time  6    Period  Weeks    Status  On-going      PT LONG TERM GOAL #4   Title  Stand 20 minutes with pain not > 2-3/10.    Time  6    Period  Weeks    Status  On-going      PT LONG TERM GOAL #5   Title  Sit 30 minutes with pain  not > 2-3/10.    Time  6    Period  Weeks    Status  On-going      PT LONG TERM GOAL #6   Title  Perform ADL's with pain not > 3/10.    Time  6    Period  Weeks    Status  On-going            Plan - 07/05/17 1242    Clinical Impression Statement  Patient tolerated treatment well. Patient noted with improved form with rows, only minimal cuing to look straight ahead rather than down. No adverse affects noted upon end of US session.     Clinical Presentation  Evolving    Clinical Decision Making  Moderate    Rehab Potential  Good    Clinical Impairments Affecting Rehab Potential  High pain-level.    PT Frequency  2x / week    PT Duration  6 weeks    PT Treatment/Interventions  ADLs/Self Care Home Management;Cryotherapy;Electrical Stimulation;Ultrasound;Moist Heat;Gait training;Stair training;Functional mobility training;Therapeutic activities;Therapeutic exercise;Patient/family education;Neuromuscular re-education;Manual techniques;Passive range of motion;Dry needling;Vasopneumatic Device    PT Next Visit Plan  Continue POC to address goals    Consulted and Agree with Plan of Care  Patient       Patient will benefit from skilled therapeutic intervention in order to improve the following deficits and impairments:  Decreased activity tolerance, Abnormal gait,  Decreased range of motion, Increased muscle spasms, Postural dysfunction, Pain  Visit Diagnosis: Acute bilateral low back pain, with sciatica presence unspecified  Stiffness of right knee, not elsewhere classified  Acute pain of right knee     Problem List Patient Active Problem List   Diagnosis Date Noted  . Tobacco use disorder 02/22/2017  . History of hysterectomy 02/22/2017  . Allergic rhinitis due to allergen 02/22/2017  . Chronic fatigue 02/22/2017    Guss BundeKrystle Marian Grandt, PT, DPT 07/05/2017, 12:48 PM  [name] [address] [place], [name], [address] Phone: [phone]   Fax:  [phone]  Name: Belinda FisherLisa D. Mariann LasterBowden MRN: [id number] Date of Birth: 06-23-1960

## 2017-07-06 ENCOUNTER — Ambulatory Visit: Payer: Worker's Compensation | Admitting: Physical Therapy

## 2017-07-06 DIAGNOSIS — M545 Low back pain: Secondary | ICD-10-CM

## 2017-07-06 DIAGNOSIS — M25561 Pain in right knee: Secondary | ICD-10-CM

## 2017-07-06 DIAGNOSIS — M25661 Stiffness of right knee, not elsewhere classified: Secondary | ICD-10-CM

## 2017-07-06 NOTE — Therapy (Signed)
[name] [address] [place], [name], [address] Phone: [phone]   Fax:  [phone]  Physical Therapy Treatment  Patient Details  Name: Sara FisherLisa D. Sara [name] MRN: [id number] Date of Birth: [birth year] Referring Provider: [name]   Encounter Date: 07/06/2017  PT End of Session - 07/06/17 1123    Visit Number  25    Number of Visits  36    Date for PT Re-Evaluation  08/04/17    PT Start Time  1116    PT Stop Time  1208    PT Time Calculation (min)  52 min    Activity Tolerance  Patient tolerated treatment well    Behavior During Therapy  [facility] for tasks assessed/performed       Past Medical History:  Diagnosis Date  . Arthritis     Past Surgical History:  Procedure Laterality Date  . ABDOMINAL HYSTERECTOMY  1986  . FRACTURE SURGERY  1979   fx collar bone    There were no vitals filed for this visit.  Subjective Assessment - 07/06/17 1123    Subjective  Patient reported feeling good. Patient stated her follow up appointment went well and she stated she will recieve shots pending worker's compensation approval.    Pertinent History  Patient reports a low back injury many years ago but she was experiencing no pain.    Limitations  Standing;Sitting    Diagnostic tests  X-ray and MRI.    Patient Stated Goals  Get out of pain and back to work.                      [facility] Adult PT Treatment/Exercise - 07/06/17 0001      Lumbar Exercises: Stretches   Active Hamstring Stretch  Right;3 reps;30 seconds    Other Lumbar Stretch Exercise  Corner stretch 4x30"      Lumbar Exercises: Aerobic   Nustep  level 5 x15 minutes      Lumbar Exercises: Standing   Other Standing Lumbar Exercises  Shoulder horizontal abduction, blue theraband 3x10      Knee/Hip Exercises: Supine   Straight Leg Raises  Strengthening;2 sets;10 reps 1#      Knee/Hip Exercises: Sidelying   Hip ABduction  Right;Strengthening;2 sets;10  reps 1#      Modalities   Modalities  Electrical Stimulation      Electrical Stimulation   Electrical Stimulation Location  mid thoracic    Electrical Stimulation Action  IFC    Electrical Stimulation Parameters  80-150 hz x10    Electrical Stimulation Goals  Pain                  PT Long Term Goals - 05/11/17 1119      PT LONG TERM GOAL #1   Title  Independent with a HEP.    Time  6    Period  Weeks    Status  Achieved      PT LONG TERM GOAL #2   Title  Full right knee extension.    Time  6    Period  Weeks    Status  On-going      PT LONG TERM GOAL #3   Title  Perform a reciprocating stair gait with pain not > 2/10.    Time  6    Period  Weeks    Status  On-going      PT LONG TERM GOAL #4   Title  Stand  20 minutes with pain not > 2-3/10.    Time  6    Period  Weeks    Status  On-going      PT LONG TERM GOAL #5   Title  Sit 30 minutes with pain not > 2-3/10.    Time  6    Period  Weeks    Status  On-going      PT LONG TERM GOAL #6   Title  Perform ADL's with pain not > 3/10.    Time  6    Period  Weeks    Status  On-going            Plan - 07/06/17 1239    Clinical Impression Statement  Patient reported no complaints with advancement of exercises. Patient educated on muscular soreness is okay when performing strengthening exercises. Patient reported understanding. No adverse affects noted upon removal of modalities.    Clinical Presentation  Evolving    Clinical Decision Making  Moderate    Rehab Potential  Good    Clinical Impairments Affecting Rehab Potential  High pain-level.    PT Frequency  2x / week    PT Duration  6 weeks    PT Treatment/Interventions  ADLs/Self Care Home Management;Cryotherapy;Electrical Stimulation;Ultrasound;Moist Heat;Gait training;Stair training;Functional mobility training;Therapeutic activities;Therapeutic exercise;Patient/family education;Neuromuscular re-education;Manual techniques;Passive range of  motion;Dry needling;Vasopneumatic Device    PT Next Visit Plan  Continue POC to address goals    Consulted and Agree with Plan of Care  Patient       Patient will benefit from skilled therapeutic intervention in order to improve the following deficits and impairments:  Decreased activity tolerance, Abnormal gait, Decreased range of motion, Increased muscle spasms, Postural dysfunction, Pain  Visit Diagnosis: Acute bilateral low back pain, with sciatica presence unspecified  Stiffness of right knee, not elsewhere classified  Acute pain of right knee     Problem List Patient Active Problem List   Diagnosis Date Noted  . Tobacco use disorder 02/22/2017  . History of hysterectomy 02/22/2017  . Allergic rhinitis due to allergen 02/22/2017  . Chronic fatigue 02/22/2017    [name] 07/06/2017, 12:43 PM  [facility] [address] [place], [place], [address] Phone: [phone]   Fax:  [phone]  Name: Sara [name] MRN: [id number] Date of Birth: May 18, 1960

## 2017-07-11 ENCOUNTER — Ambulatory Visit: Payer: Worker's Compensation | Admitting: Physical Therapy

## 2017-07-11 DIAGNOSIS — M25561 Pain in right knee: Secondary | ICD-10-CM

## 2017-07-11 DIAGNOSIS — M545 Low back pain: Secondary | ICD-10-CM

## 2017-07-11 DIAGNOSIS — M25661 Stiffness of right knee, not elsewhere classified: Secondary | ICD-10-CM

## 2017-07-11 NOTE — Therapy (Signed)
[name] [address] [place], [place], [address] Phone: [phone]   Fax:  [phone]  Physical Therapy Treatment  Patient Details  Name: Sara [name]. Bartl MRN: [id number] Date of Birth: 31-Dec-1960 Referring Provider: Marcene Corning MD   Encounter Date: 07/11/2017  PT End of Session - 07/11/17 1159    Visit Number  26    Number of Visits  36    Date for PT Re-Evaluation  08/04/17    PT Start Time  1115    PT Stop Time  1206    PT Time Calculation (min)  51 min    Activity Tolerance  Patient tolerated treatment well    Behavior During Therapy  [facility] for tasks assessed/performed       Past Medical History:  Diagnosis Date  . Arthritis     Past Surgical History:  Procedure Laterality Date  . ABDOMINAL HYSTERECTOMY  1986  . FRACTURE SURGERY  1979   fx collar bone    There were no vitals filed for this visit.  Subjective Assessment - 07/11/17 1122    Subjective  Patient stated her back is in pain today but believes its from stress from putting her dog down over the weekend. Patient reported "60% function". Patient stated she will be meeting with worker's compensation team on Friday, March 1st.    Pertinent History  Patient reports a low back injury many years ago but she was experiencing no pain.    Limitations  Standing;Sitting    Diagnostic tests  X-ray and MRI.    Patient Stated Goals  Get out of pain and back to work.                      [facility] Adult PT Treatment/Exercise - 07/11/17 0001      Lumbar Exercises: Stretches   Other Lumbar Stretch Exercise  Corner stretch 3x30"       Lumbar Exercises: Aerobic   Nustep  level 5 x15 minutes      Knee/Hip Exercises: Supine   Short Arc Quad Sets  Strengthening;Right;3 sets;10 reps 2 second hold 1#    Straight Leg Raises  Strengthening;2 sets;10 reps #1      Knee/Hip Exercises: Sidelying   Hip ADduction  --    Clams  2x10, Red      Modalities   Modalities  Geologist, engineering Location  mid thoracic    Electrical Stimulation Action  IFC    Electrical Stimulation Parameters  80-150 Hz x10    Electrical Stimulation Goals  Pain                  PT Long Term Goals - 05/11/17 1119      PT LONG TERM GOAL #1   Title  Independent with a HEP.    Time  6    Period  Weeks    Status  Achieved      PT LONG TERM GOAL #2   Title  Full right knee extension.    Time  6    Period  Weeks    Status  On-going      PT LONG TERM GOAL #3   Title  Perform a reciprocating stair gait with pain not > 2/10.    Time  6    Period  Weeks    Status  On-going      PT LONG TERM GOAL #4  Title  Stand 20 minutes with pain not > 2-3/10.    Time  6    Period  Weeks    Status  On-going      PT LONG TERM GOAL #5   Title  Sit 30 minutes with pain not > 2-3/10.    Time  6    Period  Weeks    Status  On-going      PT LONG TERM GOAL #6   Title  Perform ADL's with pain not > 3/10.    Time  6    Period  Weeks    Status  On-going            Plan - 07/11/17 1200    Clinical Impression Statement  Patient was able to complete exercises with no reports of pain. Patient stated she continues to perform HEP for back at home which helps but she still feels she is not 100% back to PLOF. Patient concerned with ongoing swelling in R knee and was instructed to elevate and ice above the heart. Patient in agreement.    Clinical Presentation  Evolving    Clinical Decision Making  Moderate    Rehab Potential  Good    Clinical Impairments Affecting Rehab Potential  High pain-level.    PT Frequency  2x / week    PT Duration  6 weeks    PT Treatment/Interventions  ADLs/Self Care Home Management;Cryotherapy;Electrical Stimulation;Ultrasound;Moist Heat;Gait training;Stair training;Functional mobility training;Therapeutic activities;Therapeutic exercise;Patient/family education;Neuromuscular  re-education;Manual techniques;Passive range of motion;Dry needling;Vasopneumatic Device    PT Next Visit Plan  Assess goals next visit. Continue POC, progress knee strengthening exercises with pain free ROM.    Consulted and Agree with Plan of Care  Patient       Patient will benefit from skilled therapeutic intervention in order to improve the following deficits and impairments:  Decreased activity tolerance, Abnormal gait, Decreased range of motion, Increased muscle spasms, Postural dysfunction, Pain  Visit Diagnosis: Acute bilateral low back pain, with sciatica presence unspecified  Stiffness of right knee, not elsewhere classified  Acute pain of right knee     Problem List Patient Active Problem List   Diagnosis Date Noted  . Tobacco use disorder 02/22/2017  . History of hysterectomy 02/22/2017  . Allergic rhinitis due to allergen 02/22/2017  . Chronic fatigue 02/22/2017   Sara [name], PT, DPT 07/11/2017, 12:12 PM  [name] [address] [place], [name], [address] Phone: [phone]   Fax:  [phone]  Name: Sara FisherLisa D. Mariann [name] MRN: [id number] Date of Birth: 1961/03/29

## 2017-07-12 ENCOUNTER — Ambulatory Visit: Payer: Worker's Compensation | Admitting: Physical Therapy

## 2017-07-12 DIAGNOSIS — M545 Low back pain: Secondary | ICD-10-CM

## 2017-07-12 DIAGNOSIS — M25561 Pain in right knee: Secondary | ICD-10-CM

## 2017-07-12 DIAGNOSIS — M25661 Stiffness of right knee, not elsewhere classified: Secondary | ICD-10-CM

## 2017-07-12 NOTE — Therapy (Signed)
[name] [address] [place], [name], [address] Phone: [phone]   Fax:  [phone]  Physical Therapy Treatment  Patient Details  Name: Sara FisherLisa D. Mariann [name] MRN: [id number] Date of Birth: 01-20-1961 Referring Provider: [name]   Encounter Date: 07/12/2017  PT End of Session - 07/12/17 0914    Visit Number  27    Number of Visits  36    Date for PT Re-Evaluation  08/04/17    PT Start Time  0900    PT Stop Time  0950    PT Time Calculation (min)  50 min    Activity Tolerance  Patient tolerated treatment well    Behavior During Therapy  [facility] for tasks assessed/performed       Past Medical History:  Diagnosis Date  . Arthritis     Past Surgical History:  Procedure Laterality Date  . ABDOMINAL HYSTERECTOMY  1986  . FRACTURE SURGERY  1979   fx collar bone    There were no vitals filed for this visit.  Subjective Assessment - 07/12/17 1115    Subjective  Patient stated she was feeling "fine" but reported Right superior patella pain last night with a painful "pop".    Pertinent History  Patient reports a low back injury many years ago but she was experiencing no pain.    Limitations  Standing;Sitting    Diagnostic tests  X-ray and MRI.    Patient Stated Goals  Get out of pain and back to work.                      [facility] Adult PT Treatment/Exercise - 07/12/17 0001      Lumbar Exercises: Stretches   Other Lumbar Stretch Exercise  --      Lumbar Exercises: Aerobic   Nustep  level 5 x15 minutes      Lumbar Exercises: Standing   Other Standing Lumbar Exercises  Shoulder horizontal abduction, greeen theraband 3x10    Other Standing Lumbar Exercises  D2 flexion with Red theraband 2x10      Knee/Hip Exercises: Stretches   Active Hamstring Stretch  30 seconds;Right;3 reps      Knee/Hip Exercises: Supine   Bridges  Strengthening;Both;3 sets;10 reps    Straight Leg Raise with External Rotation   Strengthening;2 sets;10 reps      Modalities   Modalities  Geologist, engineeringlectrical Stimulation      Electrical Stimulation   Electrical Stimulation Location  mid thoracic    Electrical Stimulation Action  IFC    Electrical Stimulation Parameters  80-150 Hz x 10    Electrical Stimulation Goals  Pain                  PT Long Term Goals - 05/11/17 1119      PT LONG TERM GOAL #1   Title  Independent with a HEP.    Time  6    Period  Weeks    Status  Achieved      PT LONG TERM GOAL #2   Title  Full right knee extension.    Time  6    Period  Weeks    Status  On-going      PT LONG TERM GOAL #3   Title  Perform a reciprocating stair gait with pain not > 2/10.    Time  6    Period  Weeks    Status  On-going      PT LONG  TERM GOAL #4   Title  Stand 20 minutes with pain not > 2-3/10.    Time  6    Period  Weeks    Status  On-going      PT LONG TERM GOAL #5   Title  Sit 30 minutes with pain not > 2-3/10.    Time  6    Period  Weeks    Status  On-going      PT LONG TERM GOAL #6   Title  Perform ADL's with pain not > 3/10.    Time  6    Period  Weeks    Status  On-going            Plan - 07/12/17 0944    Clinical Impression Statement  Patient's R knee and patella assessed, patient noted with her usual knee swelling, normal patella mobility, and tender to palpation along base of patella. Patient able to complete new thoracic exercise with no increase of pain. Patient requires verbal cuing and tactile cuing for proper form throughout the exercise. Patient stated HEP has helped with her progress in return to PLOF. PT asked if pt would like hand out of HEP; she declined stating she remembers how to perform them. Normal response to modalities upon removal of modalities.    Clinical Presentation  Evolving    Clinical Decision Making  Moderate    Rehab Potential  Good    PT Frequency  2x / week    PT Duration  6 weeks    PT Treatment/Interventions  ADLs/Self Care Home  Management;Cryotherapy;Electrical Stimulation;Ultrasound;Moist Heat;Gait training;Stair training;Functional mobility training;Therapeutic activities;Therapeutic exercise;Patient/family education;Neuromuscular re-education;Manual techniques;Passive range of motion;Dry needling;Vasopneumatic Device    PT Next Visit Plan  Assess goals next visit. Continue POC, progress knee strengthening exercises with pain free ROM.    Consulted and Agree with Plan of Care  Patient       Patient will benefit from skilled therapeutic intervention in order to improve the following deficits and impairments:  Decreased activity tolerance, Abnormal gait, Decreased range of motion, Increased muscle spasms, Postural dysfunction, Pain  Visit Diagnosis: Acute bilateral low back pain, with sciatica presence unspecified  Stiffness of right knee, not elsewhere classified  Acute pain of right knee     Problem List Patient Active Problem List   Diagnosis Date Noted  . Tobacco use disorder 02/22/2017  . History of hysterectomy 02/22/2017  . Allergic rhinitis due to allergen 02/22/2017  . Chronic fatigue 02/22/2017   Sara [name], PT, DPT 07/12/2017, 11:20 AM  [facility] [address] [place], [place], [address] Phone: [phone]   Fax:  [phone]  Name: Sara [name] MRN: [id number] Date of Birth: [birth year]

## 2017-07-13 ENCOUNTER — Ambulatory Visit: Payer: Worker's Compensation | Admitting: Physical Therapy

## 2017-07-13 DIAGNOSIS — M545 Low back pain: Secondary | ICD-10-CM

## 2017-07-13 DIAGNOSIS — M25561 Pain in right knee: Secondary | ICD-10-CM

## 2017-07-13 DIAGNOSIS — M25661 Stiffness of right knee, not elsewhere classified: Secondary | ICD-10-CM

## 2017-07-13 NOTE — Therapy (Addendum)
[name] [place], [place], [address] Phone: [phone]   Fax:  [phone]  Physical Therapy Treatment  Patient Details  Name: Sara [name] MRN: [id number] Date of Birth: 02-10-1961 Referring Provider: Melrose Nakayama MD   Encounter Date: 07/13/2017  PT End of Session - 07/13/17 1037    Visit Number  28    Number of Visits  36    Date for PT Re-Evaluation  08/04/17    PT Start Time  1030    PT Stop Time  1118    PT Time Calculation (min)  48 min    Activity Tolerance  Patient tolerated treatment well    Behavior During Therapy  [facility] for tasks assessed/performed       Past Medical History:  Diagnosis Date  . Arthritis     Past Surgical History:  Procedure Laterality Date  . ABDOMINAL HYSTERECTOMY  1986  . FRACTURE SURGERY  1979   fx collar bone    There were no vitals filed for this visit.  Subjective Assessment - 07/13/17 1104    Subjective  Patient stated she feels her function is about 65%. Patient still has difficulty with performing ADLs; she requires to take multiple breaks to complete the task secondary to pain in R knee and back.     Pertinent History  Patient reports a low back injury many years ago but she was experiencing no pain.    Limitations  Standing;Sitting    Diagnostic tests  X-ray and MRI.    Patient Stated Goals  Get out of pain and back to work.                      [facility] Adult PT Treatment/Exercise - 07/13/17 0001      Bed Mobility   Bed Mobility  --      Lumbar Exercises: Stretches   Other Lumbar Stretch Exercise  Corner stretch 3x30"      Lumbar Exercises: Aerobic   Nustep  level 5 x15 minutes      Knee/Hip Exercises: Supine   Short Arc Quad Sets  Strengthening;Right;3 sets;10 reps 2# weight at mid calf    Bridges with [employer]  Strengthening;Both;3 sets;10 reps      Modalities   Modalities  Teacher, English as a foreign language Location  mid thoracic    Electrical Stimulation Action  IFC    Electrical Stimulation Parameters  80-150 Hz x10  min    Electrical Stimulation Goals  Pain                  PT Long Term Goals - 07/13/17 1053      PT LONG TERM GOAL #2   Title  Full right knee extension.    Time  6    Period  Weeks    Status  On-going      PT LONG TERM GOAL #3   Title  Perform a reciprocating stair gait with pain not > 2/10.    Time  6    Period  Weeks    Status  Achieved      PT LONG TERM GOAL #4   Title  Stand 20 minutes with pain not > 2-3/10.    Status  On-going can stand for 60 minutes but 5/10 pain in R knee and back      PT LONG TERM GOAL #5   Title  Sit 30  minutes with pain not > 2-3/10.    Status  On-going Can sit for 30 minutes; knee is 0/10 pain; back pain 5/10      PT LONG TERM GOAL #6   Title  Perform ADL's with pain not > 3/10.    Time  6    Period  Weeks    Status  On-going Pain still present; requires multiple attempts to complete tasks            Plan - 07/13/17 1247    Clinical Impression Statement  Patient was able complete exercises with no increase of pain. Patient's goals are still ongoing as patient is limited by pain. Patient instructed again to perform HEP to optimize physical therapy. No adverse effects noted upon removal of modalities.    Clinical Presentation  Evolving    Clinical Decision Making  Moderate    Rehab Potential  Good    PT Frequency  2x / week    PT Duration  6 weeks    PT Treatment/Interventions  ADLs/Self Care Home Management;Cryotherapy;Electrical Stimulation;Ultrasound;Moist Heat;Gait training;Stair training;Functional mobility training;Therapeutic activities;Therapeutic exercise;Patient/family education;Neuromuscular re-education;Manual techniques;Passive range of motion;Dry needling;Vasopneumatic Device    PT Next Visit Plan  Continue POC, progress knee strengthening exercises with pain free ROM.     Consulted and Agree with Plan of Care  Patient       Patient will benefit from skilled therapeutic intervention in order to improve the following deficits and impairments:  Decreased activity tolerance, Abnormal gait, Decreased range of motion, Increased muscle spasms, Postural dysfunction, Pain  Visit Diagnosis: Acute bilateral low back pain, with sciatica presence unspecified  Stiffness of right knee, not elsewhere classified  Acute pain of right knee     Problem List Patient Active Problem List   Diagnosis Date Noted  . Tobacco use disorder 02/22/2017  . History of hysterectomy 02/22/2017  . Allergic rhinitis due to allergen 02/22/2017  . Chronic fatigue 02/22/2017  PHYSICAL THERAPY DISCHARGE SUMMARY  Visits from Start of Care: 28   Current functional level related to goals / functional outcomes: See above    Remaining deficits: Goals ongoing at time of DC   Education / Equipment: HEP Plan: Patient agrees to discharge.  Patient goals were not met. Patient is being discharged due to meeting the stated rehab goals.  ?????        Gabriela Eves, PT, DPT 07/13/2017, 6:26 PM  [facility] [address] [place], [place], [address] Phone: [phone]   Fax:  [phone]  Name: Sara [name] MRN: [id number] Date of Birth: Sep 24, 1960

## 2017-08-17 ENCOUNTER — Ambulatory Visit: Payer: BLUE CROSS/BLUE SHIELD | Admitting: Family Medicine

## 2017-08-17 ENCOUNTER — Encounter: Payer: Self-pay | Admitting: Family Medicine

## 2017-08-17 VITALS — BP 119/81 | HR 85 | Temp 97.6°F | Ht 63.0 in | Wt 132.0 lb

## 2017-08-17 DIAGNOSIS — N281 Cyst of kidney, acquired: Secondary | ICD-10-CM | POA: Diagnosis not present

## 2017-08-17 DIAGNOSIS — I998 Other disorder of circulatory system: Secondary | ICD-10-CM

## 2017-08-17 NOTE — Progress Notes (Signed)
Subjective: CC: renal cyst, BP fluctuation PCP: Raliegh IpGottschalk, Keidrick Murty M, DO QIO:NGEXHPI:Sara [name] is a 57 y.o. female presenting to clinic today for:  1. Renal cyst Patient reports that she has an upcoming appointment with Dr. Annabell HowellsWrenn for renal cyst appreciated incidentally on an MRI of her back.  She notes that she feels like she perhaps sometimes has increased urinary output.  Denies hematuria.  No flank pain, nausea or vomiting.  She wanted to make sure that urologist was an established physician and get my opinion on his care.  2.  Blood pressure fluctuation Patient notes that since she had work done on her back after her back injury, her blood pressure has increased to normal limits.  She notes previously she tends to be below normal as well as her heart rate.  She wonders if this has anything to do with her recent therapy.  She denies feeling poorly as a result of increased blood pressure.  No chest pain, shortness of breath out of normal, dizziness or lower extremity edema.  She notes that she has been taking multivitamins and plans on taking vitamin B for improved energy.   ROS: Per HPI  Allergies  Allergen Reactions  . Sulfa Antibiotics Rash   Past Medical History:  Diagnosis Date  . Arthritis     Current Outpatient Medications:  .  ibuprofen (ADVIL,MOTRIN) 800 MG tablet, Take 800 mg by mouth every 8 (eight) hours as needed., Disp: , Rfl:  Social History   Socioeconomic History  . Marital status: Single    Spouse name: Not on file  . Number of children: Not on file  . Years of education: Not on file  . Highest education level: Not on file  Occupational History  . Not on file  Social Needs  . Financial resource strain: Not on file  . Food insecurity:    Worry: Not on file    Inability: Not on file  . Transportation needs:    Medical: Not on file    Non-medical: Not on file  Tobacco Use  . Smoking status: Current Every Day Smoker    Packs/day: 1.00    Years: 15.00   Pack years: 15.00    Types: Cigarettes  . Smokeless tobacco: Never Used  Substance and Sexual Activity  . Alcohol use: No  . Drug use: No  . Sexual activity: Yes    Birth control/protection: None    Comment: monogomous relationship  Lifestyle  . Physical activity:    Days per week: Not on file    Minutes per session: Not on file  . Stress: Not on file  Relationships  . Social connections:    Talks on phone: Not on file    Gets together: Not on file    Attends religious service: Not on file    Active member of club or organization: Not on file    Attends meetings of clubs or organizations: Not on file    Relationship status: Not on file  . Intimate partner violence:    Fear of current or ex partner: Not on file    Emotionally abused: Not on file    Physically abused: Not on file    Forced sexual activity: Not on file  Other Topics Concern  . Not on file  Social History Narrative  . Not on file   Family History  Problem Relation Age of Onset  . COPD Mother   . Heart disease Mother   . Dementia Mother   .  Kidney disease Father   . Alzheimer's disease Father   . Prostate cancer Father   . Cancer Brother   . Colon cancer Brother 65  . Aneurysm Maternal Grandmother   . Dementia Maternal Grandfather   . Alzheimer's disease Paternal Grandmother     Objective: Office vital signs reviewed. BP 119/81   Pulse 85   Temp 97.6 F (36.4 C) (Oral)   Ht 5\' 3"  (1.6 m)   Wt 132 lb (59.9 kg)   BMI 23.38 kg/m   Physical Examination:  General: Awake, alert, well nourished, well appearing, No acute distress HEENT: Normal     Eyes: extraocular membranes intact, sclera white Cardio: regular rate  Pulm: normal work of breathing on room air  Assessment/ Plan: 57 y.o. female   1. Renal cyst Has an appointment scheduled with Dr. Annabell Howells soon.  Patient currently doing well and simply wanted reassurance that she was going to be seeing a good provider.  I did reassure her that he is  very competent and will certainly take care of all of her needs.  2. Fluctuating blood pressure Seems to have essentially stabilized.  Blood pressure well within normal limits and patient is asymptomatic.  Reassurance provided.  We will continue to follow.  Follow up as needed or in October for annual physical exam.   Raliegh Ip, DO [facility] [phone]

## 2017-08-28 ENCOUNTER — Encounter: Payer: Self-pay | Admitting: Physician Assistant

## 2017-08-28 ENCOUNTER — Ambulatory Visit: Payer: BLUE CROSS/BLUE SHIELD | Admitting: Physician Assistant

## 2017-08-28 VITALS — BP 88/62 | HR 80 | Temp 98.6°F | Ht 63.0 in | Wt 131.0 lb

## 2017-08-28 DIAGNOSIS — J40 Bronchitis, not specified as acute or chronic: Secondary | ICD-10-CM

## 2017-08-28 MED ORDER — PREDNISONE 10 MG (21) PO TBPK: ORAL_TABLET | ORAL | 0 refills | Status: DC

## 2017-08-28 MED ORDER — CEFDINIR 300 MG PO CAPS: 300.0000 mg | ORAL_CAPSULE | Freq: Two times a day (BID) | ORAL | 0 refills | Status: DC

## 2017-08-28 NOTE — Progress Notes (Signed)
BP (!) 88/62   Pulse 80   Temp 98.6 F (37 C) (Oral)   Ht 5\' 3"  (1.6 m)   Wt 131 lb (59.4 kg)   SpO2 97%   BMI 23.21 kg/m    Subjective:    Patient ID: Sara [name], female    DOB: 23-Jan-1961, 57 y.o.   MRN: [id number]  HPI: Sara [name] is a 57 y.o. female presenting on 08/28/2017 for Fever (101.9 on Wednesday ); head congestion; and chest congestion  Patient with several days of progressing upper respiratory and bronchial symptoms. Initially there was more upper respiratory congestion. This progressed to having significant cough that is productive throughout the day and severe at night. There is occasional wheezing after coughing. Sometimes there is slight dyspnea on exertion. It is productive mucus that is yellow in color. Denies any blood.  She reports that she has decided to quit smoking. Commended her on this decision  Past Medical History:  Diagnosis Date  . Arthritis    Relevant past medical, surgical, family and social history reviewed and updated as indicated. Interim medical history since our last visit reviewed. Allergies and medications reviewed and updated. DATA REVIEWED: CHART IN EPIC  Family History reviewed for pertinent findings.  Review of Systems  Constitutional: Positive for chills, fatigue and fever. Negative for activity change and appetite change.  HENT: Positive for congestion, postnasal drip, sinus pain and sore throat.   Eyes: Negative.   Respiratory: Positive for cough and wheezing. Negative for shortness of breath.   Cardiovascular: Negative.  Negative for chest pain, palpitations and leg swelling.  Gastrointestinal: Negative.   Genitourinary: Negative.   Musculoskeletal: Negative.   Skin: Negative.   Neurological: Positive for headaches.    Allergies as of 08/28/2017      Reactions   Sulfa Antibiotics Rash   Ciprofloxacin Rash      Medication List        Accurate as of 08/28/17  5:37 PM. Always use your most recent med list.          cefdinir 300 MG capsule Commonly known as:  OMNICEF Take 1 capsule (300 mg total) by mouth 2 (two) times daily. 1 po BID   ibuprofen 800 MG tablet Commonly known as:  ADVIL,MOTRIN Take 800 mg by mouth every 8 (eight) hours as needed.   predniSONE 10 MG (21) Tbpk tablet Commonly known as:  STERAPRED UNI-PAK 21 TAB As directed x 6 days          Objective:    BP (!) 88/62   Pulse 80   Temp 98.6 F (37 C) (Oral)   Ht 5\' 3"  (1.6 m)   Wt 131 lb (59.4 kg)   SpO2 97%   BMI 23.21 kg/m   Allergies  Allergen Reactions  . Sulfa Antibiotics Rash  . Ciprofloxacin Rash    Wt Readings from Last 3 Encounters:  08/28/17 131 lb (59.4 kg)  08/17/17 132 lb (59.9 kg)  02/22/17 126 lb (57.2 kg)    Physical Exam  Constitutional: She is oriented to person, place, and time. She appears well-developed and well-nourished.  HENT:  Head: Normocephalic and atraumatic.  Right Ear: There is drainage and tenderness.  Left Ear: There is drainage and tenderness.  Nose: Mucosal edema and rhinorrhea present. Right sinus exhibits no maxillary sinus tenderness and no frontal sinus tenderness. Left sinus exhibits no maxillary sinus tenderness and no frontal sinus tenderness.  Mouth/Throat: Oropharyngeal exudate and posterior oropharyngeal erythema present.  Eyes:  Pupils are equal, round, and reactive to light. Conjunctivae and EOM are normal.  Neck: Normal range of motion. Neck supple.  Cardiovascular: Normal rate, regular rhythm, normal heart sounds and intact distal pulses.  Pulmonary/Chest: Effort normal. She has wheezes in the right upper field and the left upper field.  Abdominal: Soft. Bowel sounds are normal.  Neurological: She is alert and oriented to person, place, and time. She has normal reflexes.  Skin: Skin is warm and dry. No rash noted.  Psychiatric: She has a normal mood and affect. Her behavior is normal. Judgment and thought content normal.        Assessment & Plan:   1.  Bronchitis - cefdinir (OMNICEF) 300 MG capsule; Take 1 capsule (300 mg total) by mouth 2 (two) times daily. 1 po BID  Dispense: 20 capsule; Refill: 0 - predniSONE (STERAPRED UNI-PAK 21 TAB) 10 MG (21) TBPK tablet; As directed x 6 days  Dispense: 21 tablet; Refill: 0   Continue all other maintenance medications as listed above.  Follow up plan: Return if symptoms worsen or fail to improve.  Educational handout given for survey  Remus Loffler PA-C [facility] [address]  [place], [place] [address] [phone]   08/28/2017, 5:37 PM

## 2017-08-28 NOTE — Patient Instructions (Signed)

## 2017-09-11 DIAGNOSIS — M25561 Pain in right knee: Secondary | ICD-10-CM | POA: Diagnosis not present

## 2017-09-13 DIAGNOSIS — N281 Cyst of kidney, acquired: Secondary | ICD-10-CM | POA: Diagnosis not present

## 2017-09-18 DIAGNOSIS — M545 Low back pain: Secondary | ICD-10-CM | POA: Diagnosis not present

## 2017-10-03 ENCOUNTER — Telehealth: Payer: Self-pay | Admitting: Family Medicine

## 2017-10-25 ENCOUNTER — Ambulatory Visit: Payer: BLUE CROSS/BLUE SHIELD | Admitting: Family Medicine

## 2017-10-25 ENCOUNTER — Ambulatory Visit (INDEPENDENT_AMBULATORY_CARE_PROVIDER_SITE_OTHER): Payer: BLUE CROSS/BLUE SHIELD

## 2017-10-25 ENCOUNTER — Encounter: Payer: Self-pay | Admitting: Family Medicine

## 2017-10-25 VITALS — BP 97/58 | HR 66 | Temp 97.0°F | Ht 63.0 in | Wt 130.0 lb

## 2017-10-25 DIAGNOSIS — R05 Cough: Secondary | ICD-10-CM

## 2017-10-25 DIAGNOSIS — R5382 Chronic fatigue, unspecified: Secondary | ICD-10-CM | POA: Diagnosis not present

## 2017-10-25 DIAGNOSIS — F17200 Nicotine dependence, unspecified, uncomplicated: Secondary | ICD-10-CM

## 2017-10-25 DIAGNOSIS — R059 Cough, unspecified: Secondary | ICD-10-CM

## 2017-10-25 NOTE — Patient Instructions (Signed)
Your xray looks consistent with COPD.  We can set up pulmonary function tests if you desire.   Chronic Obstructive Pulmonary Disease Chronic obstructive pulmonary disease (COPD) is a long-term (chronic) lung problem. When you have COPD, it is hard for air to get in and out of your lungs. The way your lungs work will never return to normal. Usually the condition gets worse over time. There are things you can do to keep yourself as healthy as possible. Your doctor may treat your condition with:  Medicines.  Quitting smoking, if you smoke.  Rehabilitation. This may involve a team of specialists.  Oxygen.  Exercise and changes to your diet.  Lung surgery.  Comfort measures (palliative care).  Follow these instructions at home: Medicines  Take over-the-counter and prescription medicines only as told by your doctor.  Talk to your doctor before taking any cough or allergy medicines. You may need to avoid medicines that cause your lungs to be dry. Lifestyle  If you smoke, stop. Smoking makes the problem worse. If you need help quitting, ask your doctor.  Avoid being around things that make your breathing worse. This may include smoke, chemicals, and fumes.  Stay active, but remember to also rest.  Learn and use tips on how to relax.  Make sure you get enough sleep. Most adults need at least 7 hours a night.  Eat healthy foods. Eat smaller meals more often. Rest before meals. Controlled breathing  Learn and use tips on how to control your breathing as told by your doctor. Try: ? Breathing in (inhaling) through your nose for 1 second. Then, pucker your lips and breath out (exhale) through your lips for 2 seconds. ? Putting one hand on your belly (abdomen). Breathe in slowly through your nose for 1 second. Your hand on your belly should move out. Pucker your lips and breathe out slowly through your lips. Your hand on your belly should move in as you breathe out. Controlled  coughing  Learn and use controlled coughing to clear mucus from your lungs. The steps are: 1. Lean your head a little forward. 2. Breathe in deeply. 3. Try to hold your breath for 3 seconds. 4. Keep your mouth slightly open while coughing 2 times. 5. Spit any mucus out into a tissue. 6. Rest and do the steps again 1 or 2 times as needed. General instructions  Make sure you get all the shots (vaccines) that your doctor recommends. Ask your doctor about a flu shot and a pneumonia shot.  Use oxygen therapy and therapy to help improve your lungs (pulmonary rehabilitation) if told by your doctor. If you need home oxygen therapy, ask your doctor if you should buy a tool to measure your oxygen level (oximeter).  Make a COPD action plan with your doctor. This helps you know what to do if you feel worse than usual.  Manage any other conditions you have as told by your doctor.  Avoid going outside when it is very hot, cold, or humid.  Avoid people who have a sickness you can catch (contagious).  Keep all follow-up visits as told by your doctor. This is important. Contact a doctor if:  You cough up more mucus than usual.  There is a change in the color or thickness of the mucus.  It is harder to breathe than usual.  Your breathing is faster than usual.  You have trouble sleeping.  You need to use your medicines more often than usual.  You have  trouble doing your normal activities such as getting dressed or walking around the house. Get help right away if:  You have shortness of breath while resting.  You have shortness of breath that stops you from: ? Being able to talk. ? Doing normal activities.  Your chest hurts for longer than 5 minutes.  Your skin color is more blue than usual.  Your pulse oximeter shows that you have low oxygen for longer than 5 minutes.  You have a fever.  You feel too tired to breathe normally. Summary  Chronic obstructive pulmonary disease  (COPD) is a long-term lung problem.  The way your lungs work will never return to normal. Usually the condition gets worse over time. There are things you can do to keep yourself as healthy as possible.  Take over-the-counter and prescription medicines only as told by your doctor.  If you smoke, stop. Smoking makes the problem worse. This information is not intended to replace advice given to you by your health care provider. Make sure you discuss any questions you have with your health care provider. Document Released: 10/19/2007 Document Revised: 10/08/2015 Document Reviewed: 12/27/2012 Elsevier Interactive Patient Education  2017 [employer]

## 2017-10-25 NOTE — Progress Notes (Signed)
Subjective: CC: low energy PCP: Janora Norlander, DO OHY:WVPX Sara [name] is a 57 y.o. female presenting to clinic today for:  1. Low energy Patient reports that she has had a long-standing issue with low energy.  She brings multiple supplements to the office which she has been taking in efforts to improve her health.  She is on OTC vitamin D, B12, multivitamin and astaxanthin.  She notes that she is a longtime smoker, with a greater than 35-year pack history.  She is attempt to decrease amount of tobacco use and is now under a pack per day.  She notes that she "cannot quit".  She does report cough and morning fatigue despite adequate rest.  Denies any hemoptysis, unplanned weight loss, night sweats.  No shortness of breath or wheeze.   ROS: Per HPI  Allergies  Allergen Reactions  . Sulfa Antibiotics Rash  . Ciprofloxacin Rash   Past Medical History:  Diagnosis Date  . Arthritis    No current outpatient medications on file. Social History   Socioeconomic History  . Marital status: Single    Spouse name: Not on file  . Number of children: Not on file  . Years of education: Not on file  . Highest education level: Not on file  Occupational History  . Not on file  Social Needs  . Financial resource strain: Not on file  . Food insecurity:    Worry: Not on file    Inability: Not on file  . Transportation needs:    Medical: Not on file    Non-medical: Not on file  Tobacco Use  . Smoking status: Current Every Day Smoker    Packs/day: 1.00    Years: 15.00    Pack years: 15.00    Types: Cigarettes  . Smokeless tobacco: Never Used  Substance and Sexual Activity  . Alcohol use: No  . Drug use: No  . Sexual activity: Yes    Birth control/protection: None    Comment: monogomous relationship  Lifestyle  . Physical activity:    Days per week: Not on file    Minutes per session: Not on file  . Stress: Not on file  Relationships  . Social connections:    Talks on phone:  Not on file    Gets together: Not on file    Attends religious service: Not on file    Active member of club or organization: Not on file    Attends meetings of clubs or organizations: Not on file    Relationship status: Not on file  . Intimate partner violence:    Fear of current or ex partner: Not on file    Emotionally abused: Not on file    Physically abused: Not on file    Forced sexual activity: Not on file  Other Topics Concern  . Not on file  Social History Narrative  . Not on file   Family History  Problem Relation Age of Onset  . COPD Mother   . Heart disease Mother   . Dementia Mother   . Kidney disease Father   . Alzheimer's disease Father   . Prostate cancer Father   . Cancer Brother   . Colon cancer Brother 67  . Aneurysm Maternal Grandmother   . Dementia Maternal Grandfather   . Alzheimer's disease Paternal Grandmother     Objective: Office vital signs reviewed. BP (!) 97/58   Pulse 66   Temp (!) 97 F (36.1 C) (Oral)   Ht  $'5\' 3"'a$  (1.6 m)   Wt 130 lb (59 kg)   SpO2 98%   BMI 23.03 kg/m   Physical Examination:  General: Awake, alert, nontoxic, No acute distress HEENT: Normal    Neck: No masses palpated. No lymphadenopathy    Eyes: PERRLA, extraocular membranes intact, sclera white    Throat: moist mucus membranes. Cardio: regular rate and rhythm, S1S2 heard, no murmurs appreciated Pulm: Globally decreased breath sounds. no wheezes, rhonchi or rales; normal work of breathing on room air Extremities: warm, well perfused, No edema, cyanosis or clubbing; +2 pulses bilaterally MSK: normal gait and normal station  Dg Chest 2 View  Result Date: 10/25/2017 CLINICAL DATA:  Cough.  Smoker. EXAM: CHEST - 2 VIEW COMPARISON:  None. FINDINGS: The heart size and mediastinal contours are within normal limits. Both lungs are clear. The visualized skeletal structures are unremarkable. IMPRESSION: No active cardiopulmonary disease. Electronically Signed   By: Titus Dubin M.D.   On: 10/25/2017 09:27    Assessment/ Plan: 57 y.o. female   1. Chronic fatigue Reviewed that her labs previously have been within normal limits.  Will check CMP and CBC today.  Okay to continue vitamins for now.  I do question whether or not her chronic fatigue is a manifestation of possible depression or anxiety.  Alternatively, may be related to chronic smoking status and what appears to be COPD on her x-ray.  We discussed consideration for pulmonary function testing versus trial of Symbicort versus referral to pulmonology given that her chronic cough and fatigue.  Other differential diagnosis for her chronic fatigue include obstructive sleep apnea.  She does not quite have the body habitus for this but we could certainly send her for sleep study to rule this out as a contributing factor to her chronic fatigue. - CMP14+EGFR - CBC with Differential  2. Cough Likely related to smoking.  No evidence of infection on today's exam.  Chest x-ray with no acute infiltrates but upon personal review her lungs do seem to be hyperinflated, consistent with COPD. - DG Chest 2 View; Future  3. Tobacco use disorder Contemplative.  We will continue to address this with each visit.   Orders Placed This Encounter  Procedures  . DG Chest 2 View    Standing Status:   Future    Number of Occurrences:   1    Standing Expiration Date:   12/26/2018    Order Specific Question:   Reason for Exam (SYMPTOM  OR DIAGNOSIS REQUIRED)    Answer:   cough several weeks.  35pack year history    Order Specific Question:   Is patient pregnant?    Answer:   No    Order Specific Question:   Preferred imaging location?    Answer:   Internal    Order Specific Question:   Radiology Contrast Protocol - do NOT remove file path    Answer:   \\[email or web address]  . CMP14+EGFR  . CBC with Differential    Janora Norlander, [place] [phone]

## 2017-10-26 LAB — CMP14+EGFR
ALT: 9 IU/L (ref 0–32)
AST: 12 IU/L (ref 0–40)
Albumin/Globulin Ratio: 1.9 (ref 1.2–2.2)
Albumin: 4.3 g/dL (ref 3.5–5.5)
Alkaline Phosphatase: 69 IU/L (ref 39–117)
BUN/Creatinine Ratio: 13 (ref 9–23)
BUN: 11 mg/dL (ref 6–24)
Bilirubin Total: 0.4 mg/dL (ref 0.0–1.2)
CO2: 26 mmol/L (ref 20–29)
Calcium: 9.1 mg/dL (ref 8.7–10.2)
Chloride: 105 mmol/L (ref 96–106)
Creatinine, Ser: 0.82 mg/dL (ref 0.57–1.00)
GFR calc Af Amer: 93 mL/min/{1.73_m2} (ref >59)
GFR calc non Af Amer: 81 mL/min/{1.73_m2} (ref >59)
Globulin, Total: 2.3 g/dL (ref 1.5–4.5)
Glucose: 77 mg/dL (ref 65–99)
Potassium: 4.3 mmol/L (ref 3.5–5.2)
Sodium: 143 mmol/L (ref 134–144)
Total Protein: 6.6 g/dL (ref 6.0–8.5)

## 2017-10-26 LAB — CBC WITH DIFFERENTIAL/PLATELET
Basophils Absolute: 0 10*3/uL (ref 0.0–0.2)
Basos: 1 %
EOS (ABSOLUTE): 0.2 10*3/uL (ref 0.0–0.4)
Eos: 3 %
Hematocrit: 43.7 % (ref 34.0–46.6)
Hemoglobin: 14.3 g/dL (ref 11.1–15.9)
Immature Grans (Abs): 0 10*3/uL (ref 0.0–0.1)
Immature Granulocytes: 0 %
Lymphocytes Absolute: 2.4 10*3/uL (ref 0.7–3.1)
Lymphs: 34 %
MCH: 31.8 pg (ref 26.6–33.0)
MCHC: 32.7 g/dL (ref 31.5–35.7)
MCV: 97 fL (ref 79–97)
Monocytes Absolute: 0.6 10*3/uL (ref 0.1–0.9)
Monocytes: 8 %
Neutrophils Absolute: 4 10*3/uL (ref 1.4–7.0)
Neutrophils: 54 %
Platelets: 231 10*3/uL (ref 150–450)
RBC: 4.5 x10E6/uL (ref 3.77–5.28)
RDW: 13.4 % (ref 12.3–15.4)
WBC: 7.2 10*3/uL (ref 3.4–10.8)

## 2017-11-14 DIAGNOSIS — L700 Acne vulgaris: Secondary | ICD-10-CM | POA: Diagnosis not present

## 2017-11-14 DIAGNOSIS — L301 Dyshidrosis [pompholyx]: Secondary | ICD-10-CM | POA: Diagnosis not present

## 2017-11-30 DIAGNOSIS — M47814 Spondylosis without myelopathy or radiculopathy, thoracic region: Secondary | ICD-10-CM | POA: Diagnosis not present

## 2017-11-30 DIAGNOSIS — M47816 Spondylosis without myelopathy or radiculopathy, lumbar region: Secondary | ICD-10-CM | POA: Diagnosis not present

## 2017-11-30 DIAGNOSIS — M546 Pain in thoracic spine: Secondary | ICD-10-CM | POA: Diagnosis not present

## 2017-11-30 DIAGNOSIS — M545 Low back pain: Secondary | ICD-10-CM | POA: Diagnosis not present

## 2017-12-12 ENCOUNTER — Encounter: Payer: Self-pay | Admitting: Physical Therapy

## 2017-12-12 ENCOUNTER — Ambulatory Visit: Payer: BLUE CROSS/BLUE SHIELD | Attending: Orthopedic Surgery | Admitting: Physical Therapy

## 2017-12-12 DIAGNOSIS — M545 Low back pain: Secondary | ICD-10-CM | POA: Diagnosis not present

## 2017-12-12 DIAGNOSIS — G8929 Other chronic pain: Secondary | ICD-10-CM | POA: Diagnosis not present

## 2017-12-12 DIAGNOSIS — M546 Pain in thoracic spine: Secondary | ICD-10-CM | POA: Diagnosis not present

## 2017-12-12 NOTE — Therapy (Signed)
[name] [address] [place], [place], [address] Phone: [phone]   Fax:  [phone]  Physical Therapy Evaluation  Patient Details  Name: Sara [name] MRN: [id number] Date of Birth: February 08, 1961 Referring Provider: Raj Janus, MD   Encounter Date: 12/12/2017  PT End of Session - 12/12/17 1209    Visit Number  1    Number of Visits  6    Date for PT Re-Evaluation  01/30/18    PT Start Time  1115    PT Stop Time  1203    PT Time Calculation (min)  48 min    Activity Tolerance  Patient tolerated treatment well    Behavior During Therapy  [facility] for tasks assessed/performed       Past Medical History:  Diagnosis Date  . Arthritis     Past Surgical History:  Procedure Laterality Date  . ABDOMINAL HYSTERECTOMY  1986  . FRACTURE SURGERY  1979   fx collar bone    There were no vitals filed for this visit.   Subjective Assessment - 12/12/17 1423    Subjective  Patient arrives to physical therapy with reports of ongoing thoracic and low back pain. Patient reports difficulties with doing usual household activities. She describes pain is worse in thoracic spine and describes pain as being stiff. Patient states pain at worst is 9/10 and occurs while lifting, performing laundry, and washing dishes. Patient reports at best pain is 6/10 with Motrin 2x a day. Patient reports her x-ray stated arthritis throughout spine. Patient's goals are to decrease pain and return to working.    Limitations  Lifting;House hold activities    How long can you sit comfortably?  15 minutes    How long can you stand comfortably?  30 minutes    Diagnostic tests  X-ray: arthritis    Patient Stated Goals  Get out of pain and back to work.    Currently in Pain?  Yes    Pain Score  6     Pain Location  Back    Pain Orientation  Lower;Mid    Pain Descriptors / Indicators  Constant;Tightness    Pain Type  Chronic pain    Pain Onset  More than a month ago     Pain Frequency  Constant    Aggravating Factors   increased activity    Pain Relieving Factors  Rest and motrin    Effect of Pain on Daily Activities  difficulties with all ADLs.     Multiple Pain Sites  No         [facility] PT Assessment - 12/12/17 0001      Assessment   Medical Diagnosis  low back pain and thoracic pain     Referring Provider  Raj Janus, MD    Onset Date/Surgical Date  -- ongoing    Next MD Visit  01/31/18    Prior Therapy  yes      Precautions   Precautions  None      Restrictions   Weight Bearing Restrictions  No      Balance Screen   Has the patient fallen in the past 6 months  No    Has the patient had a decrease in activity level because of a fear of falling?   No    Is the patient reluctant to leave their home because of a fear of falling?   No      Home Public house manager  residence    Living Arrangements  Alone      Prior Function   Level of Independence  Independent      Posture/Postural Control   Posture/Postural Control  Postural limitations    Postural Limitations  Rounded Shoulders;Forward head;Decreased lumbar lordosis      ROM / Strength   AROM / PROM / Strength  AROM;Strength      AROM   Overall AROM Comments  Lumbar AROM WFL in all planes      Strength   Strength Assessment Site  Knee;Hip    Right/Left Hip  Right;Left    Right Hip Flexion  4/5    Right Hip Extension  3/5    Right Hip ABduction  3+/5    Left Hip Flexion  4/5    Left Hip Extension  3/5    Left Hip ABduction  3+/5    Right/Left Knee  Right;Left    Right Knee Flexion  4+/5    Right Knee Extension  4-/5    Left Knee Flexion  4/5    Left Knee Extension  4-/5      Palpation   Palpation comment  tenderness to palpation along thoracic paraspinals as well as lumbar paraspinals and upper gluteals and bilateral QLs      Ambulation/Gait   Gait Pattern  Within Functional Limits                Objective measurements completed  on examination: See above findings.      [facility] Adult PT Treatment/Exercise - 12/12/17 0001      Modalities   Modalities  Electrical Stimulation      Moist Heat Therapy   Number Minutes Moist Heat  10 Minutes    Moist Heat Location  Lumbar Spine      Electrical Stimulation   Electrical Stimulation Location  mid thoracic and low back    Electrical Stimulation Action  pre-mod 2 channels    Electrical Stimulation Parameters  80-150 hz x10 min    Electrical Stimulation Goals  Pain             PT Education - 12/12/17 1427    Education provided  Yes    Education Details  draw ins, scapular retractions, rows, extension, seated thoracic flexion    Person(s) Educated  Patient    Methods  Explanation;Demonstration;Handout    Comprehension  Verbalized understanding;Returned demonstration          PT Long Term Goals - 12/12/17 1428      PT LONG TERM GOAL #1   Title  Patient will be independent with HEP    Time  6    Period  Weeks    Status  New      PT LONG TERM GOAL #2   Title  Patient will demonstrate 4/5 or greater bilateral hip abduction and extension MMT to improve strength and stability during ADLs.    Time  6    Period  Weeks    Status  New      PT LONG TERM GOAL #3   Title  Patient will report ablity to sit for 30 minutes or greater with pain less than 3/10 in thoracic and lumbar spine.     Time  6    Period  Weeks    Status  New      PT LONG TERM GOAL #4   Title  Patient will report ability to perform ADLs with less than 4/10 pain in thoracic and  lumbar spine.    Time  6    Period  Weeks    Status  New      PT LONG TERM GOAL #5   Title  Patient will demonstrate proper lifting body mechanics to protect back while performing laundry and other functional tasks.    Time  6    Period  Weeks    Status  New             Plan - 12/12/17 1425    Clinical Impression Statement  Patient is a 57 year old female who presents to physical therapy with  ongoing thoracic and lumbar pain. Patient noted with fair plus hip extension and abduction strength bilaterally. [facility] with L sided lumbar pain with L side bending. Patient demonstrates forward head, rounded shoulders. Patient noted with tenderness to palpation along thoracic paraspinals from approximately T4 to T7 as well as bilateral QLs, upper gluteals and lumbar paraspinals. Secondary to financial difficulties, patient would like to attend physical therapy 1x/week and attempt 2x/week if improvements are not made with HEP. Patient educated the importance of performing HEP daily to optimize benefits of physical therapy. Patient reported understanding. Patient would benefit from skilled physical therapy to address deficits and address patient's goals.     Clinical Presentation  Evolving    Clinical Presentation due to:  minimal improvements    Clinical Decision Making  Moderate    Rehab Potential  Good    Clinical Impairments Affecting Rehab Potential  High pain-level.    PT Frequency  2x / week 1-2x per week    PT Duration  6 weeks    PT Treatment/Interventions  ADLs/Self Care Home Management;Cryotherapy;Electrical Stimulation;Ultrasound;Moist Heat;Gait training;Stair training;Functional mobility training;Therapeutic activities;Therapeutic exercise;Patient/family education;Neuromuscular re-education;Manual techniques;Passive range of motion;Dry needling;Vasopneumatic Device;Taping    PT Next Visit Plan  Nustep, core strengthening, postural exercises, modalities PRN for pain relief.    PT Home Exercise Plan  see patient education section    Consulted and Agree with Plan of Care  Patient       Patient will benefit from skilled therapeutic intervention in order to improve the following deficits and impairments:  Decreased activity tolerance, Abnormal gait, Decreased range of motion, Increased muscle spasms, Postural dysfunction, Pain  Visit Diagnosis: Pain in thoracic  spine  Chronic bilateral low back pain, with sciatica presence unspecified     Problem List Patient Active Problem List   Diagnosis Date Noted  . Tobacco use disorder 02/22/2017  . History of hysterectomy 02/22/2017  . Allergic rhinitis due to allergen 02/22/2017  . Chronic fatigue 02/22/2017  . S/P hysterectomy 09/13/2013   Guss BundeKrystle Granite Godman, PT, DPT 12/12/2017, 4:37 PM  [name] [address] [place], [name], [address] Phone: [phone]   Fax:  [phone]  Name: Sara [name] MRN: [id number] Date of Birth: 1960/10/10

## 2017-12-12 NOTE — Patient Instructions (Signed)
   Shyane Fossum, PT, DPT [name] [address] [name], [place], [address] Phone: [phone]   Fax:  [phone]  

## 2017-12-19 ENCOUNTER — Ambulatory Visit: Payer: BLUE CROSS/BLUE SHIELD | Admitting: Physical Therapy

## 2017-12-21 ENCOUNTER — Ambulatory Visit: Payer: BLUE CROSS/BLUE SHIELD | Admitting: Physical Therapy

## 2017-12-26 ENCOUNTER — Encounter: Payer: Self-pay | Admitting: Physical Therapy

## 2017-12-26 ENCOUNTER — Ambulatory Visit: Payer: BLUE CROSS/BLUE SHIELD | Attending: Orthopedic Surgery | Admitting: Physical Therapy

## 2017-12-26 DIAGNOSIS — M545 Low back pain: Secondary | ICD-10-CM | POA: Diagnosis not present

## 2017-12-26 DIAGNOSIS — M546 Pain in thoracic spine: Secondary | ICD-10-CM | POA: Insufficient documentation

## 2017-12-26 DIAGNOSIS — G8929 Other chronic pain: Secondary | ICD-10-CM | POA: Diagnosis not present

## 2017-12-26 NOTE — Therapy (Signed)
[name] [address] [place], [place], [address] Phone: [phone]   Fax:  [phone]  Physical Therapy Treatment  Patient Details  Name: Sara [name] MRN: [id number] Date of Birth: 06-18-1960 Referring Provider: Raj Janus, MD   Encounter Date: 12/26/2017  PT End of Session - 12/26/17 1126    Visit Number  2    Number of Visits  6    Date for PT Re-Evaluation  01/30/18    PT Start Time  1030    PT Stop Time  1120    PT Time Calculation (min)  50 min    Activity Tolerance  Patient tolerated treatment well    Behavior During Therapy  [facility] for tasks assessed/performed       Past Medical History:  Diagnosis Date  . Arthritis     Past Surgical History:  Procedure Laterality Date  . ABDOMINAL HYSTERECTOMY  1986  . FRACTURE SURGERY  1979   fx collar bone    There were no vitals filed for this visit.  Subjective Assessment - 12/26/17 1104    Subjective  Patient arrives with 6/10 pain in mid-back and lumbar spine.     Pertinent History  Patient reports a low back injury many years ago but she was experiencing no pain.    Limitations  Lifting;House hold activities    How long can you sit comfortably?  15 minutes    How long can you stand comfortably?  30 minutes    Diagnostic tests  X-ray: arthritis    Patient Stated Goals  Get out of pain and back to work.    Currently in Pain?  Yes    Pain Score  6     Pain Orientation  Mid;Lower    Pain Descriptors / Indicators  Tightness   stiff   Pain Type  Chronic pain    Pain Onset  More than a month ago    Pain Frequency  Constant         [facility] PT Assessment - 12/26/17 0001      Assessment   Medical Diagnosis  low back pain and thoracic pain     Next MD Visit  01/31/18    Prior Therapy  yes                   [facility] Adult PT Treatment/Exercise - 12/26/17 0001      Exercises   Exercises  Lumbar      Lumbar Exercises: Stretches   Double Knee to Chest  Stretch  3 reps;30 seconds    Lower Trunk Rotation  3 reps;30 seconds    Other Lumbar Stretch Exercise  thoracic rotation stretch 3" hold at end range for total of x1 minute      Lumbar Exercises: Aerobic   Nustep  Level 3 x15 mins      Lumbar Exercises: Standing   Row  Strengthening;Both;20 reps;10 reps   x30     Theraband Level (Row)  Level 2 (Red)    Shoulder Extension  Strengthening;10 reps;20 reps;Theraband   x30   Theraband Level (Shoulder Extension)  Level 2 (Red)      Lumbar Exercises: Supine   Bridge  20 reps      Lumbar Exercises: Sidelying   Hip Abduction  Both;20 reps   2x10     Modalities   Modalities  Electrical Stimulation      Moist Heat Therapy   Number Minutes Moist Heat  15 Minutes  Moist Heat Location  Lumbar Spine      Electrical Stimulation   Electrical Stimulation Location  mid thoracic and low back    Electrical Stimulation Action  Pre-mod 2 channels    Electrical Stimulation Parameters  80-150 hz x15 minutes    Electrical Stimulation Goals  Pain                  PT Long Term Goals - 12/12/17 1428      PT LONG TERM GOAL #1   Title  Patient will be independent with HEP    Time  6    Period  Weeks    Status  New      PT LONG TERM GOAL #2   Title  Patient will demonstrate 4/5 or greater bilateral hip abduction and extension MMT to improve strength and stability during ADLs.    Time  6    Period  Weeks    Status  New      PT LONG TERM GOAL #3   Title  Patient will report ablity to sit for 30 minutes or greater with pain less than 3/10 in thoracic and lumbar spine.     Time  6    Period  Weeks    Status  New      PT LONG TERM GOAL #4   Title  Patient will report ability to perform ADLs with less than 4/10 pain in thoracic and lumbar spine.    Time  6    Period  Weeks    Status  New      PT LONG TERM GOAL #5   Title  Patient will demonstrate proper lifting body mechanics to protect back while performing laundry and other  functional tasks.    Time  6    Period  Weeks    Status  New            Plan - 12/26/17 1215    Clinical Impression Statement  Patient was able to tolerate treatment well with good form after initial demonstration. Patient noted with increased knee pain with double knee to chest stretch; patient instructed to pull from back of the knees rather than on top. Patient reported a decrease in pain. PT and patient reviewed HEP and theraband set up; patient reported understanding. Normal response to modalities upon removal.     Clinical Presentation  Evolving    Clinical Decision Making  Moderate    Rehab Potential  Good    Clinical Impairments Affecting Rehab Potential  High pain-level.    PT Frequency  2x / week    PT Duration  6 weeks    PT Treatment/Interventions  ADLs/Self Care Home Management;Cryotherapy;Electrical Stimulation;Ultrasound;Moist Heat;Gait training;Stair training;Functional mobility training;Therapeutic activities;Therapeutic exercise;Patient/family education;Neuromuscular re-education;Manual techniques;Passive range of motion;Dry needling;Vasopneumatic Device;Taping    PT Next Visit Plan  Nustep, core strengthening, postural exercises, modalities PRN for pain relief.    Consulted and Agree with Plan of Care  Patient       Patient will benefit from skilled therapeutic intervention in order to improve the following deficits and impairments:  Decreased activity tolerance, Abnormal gait, Decreased range of motion, Increased muscle spasms, Postural dysfunction, Pain  Visit Diagnosis: Pain in thoracic spine  Chronic bilateral low back pain, with sciatica presence unspecified     Problem List Patient Active Problem List   Diagnosis Date Noted  . Tobacco use disorder 02/22/2017  . History of hysterectomy 02/22/2017  . Allergic rhinitis due to allergen 02/22/2017  .  Chronic fatigue 02/22/2017  . S/P hysterectomy 09/13/2013   Guss BundeKrystle Calina Patrie, PT, DPT 12/26/2017,  12:26 PM  [name] [address] [place], [name], [address] Phone: [phone]   Fax:  [phone]  Name: Sara [name] MRN: [id number] Date of Birth: 07/14/1960

## 2018-01-02 ENCOUNTER — Ambulatory Visit: Payer: BLUE CROSS/BLUE SHIELD | Admitting: Physical Therapy

## 2018-01-02 DIAGNOSIS — M545 Low back pain: Secondary | ICD-10-CM

## 2018-01-02 DIAGNOSIS — M546 Pain in thoracic spine: Secondary | ICD-10-CM

## 2018-01-02 DIAGNOSIS — G8929 Other chronic pain: Secondary | ICD-10-CM

## 2018-01-02 NOTE — Therapy (Signed)
[name] [address] [place], [name], [address] Phone: [phone]   Fax:  [phone]  Physical Therapy Treatment  Patient Details  Name: Sara FisherLisa D. Mariann LasterBowden MRN: [id number] Date of Birth: 1960-12-29 Referring Provider: Raj JanusHarlan Daubert, MD   Encounter Date: 01/02/2018  PT End of Session - 01/02/18 1304    Visit Number  3    Number of Visits  6    Date for PT Re-Evaluation  01/30/18    PT Start Time  1300   assessment of blood pressure   PT Stop Time  1400    PT Time Calculation (min)  60 min    Activity Tolerance  Patient tolerated treatment well    Behavior During Therapy  [facility] for tasks assessed/performed       Past Medical History:  Diagnosis Date  . Arthritis     Past Surgical History:  Procedure Laterality Date  . ABDOMINAL HYSTERECTOMY  1986  . FRACTURE SURGERY  1979   fx collar bone    There were no vitals filed for this visit.  Subjective Assessment - 01/02/18 1304    Subjective  Patient reports about 8/10 in mid-back today. Patient reported she mowed her lawn for 35-40 minutes this morning with intermittent rest breaks.    Pertinent History  Patient reports a low back injury many years ago but she was experiencing no pain.    Limitations  Lifting;House hold activities    How long can you sit comfortably?  15 minutes    How long can you stand comfortably?  30 minutes    Diagnostic tests  X-ray: arthritis    Patient Stated Goals  Get out of pain and back to work.    Currently in Pain?  Yes    Pain Score  8     Pain Orientation  Mid    Pain Descriptors / Indicators  Aching    Pain Type  Chronic pain    Pain Onset  More than a month ago    Pain Frequency  Constant    Multiple Pain Sites  No         [facility] PT Assessment - 01/02/18 0001      Assessment   Medical Diagnosis  low back pain and thoracic pain     Next MD Visit  01/31/18    Prior Therapy  yes                   [facility] Adult PT  Treatment/Exercise - 01/02/18 0001      Exercises   Exercises  Lumbar      Lumbar Exercises: Stretches   Other Lumbar Stretch Exercise  thoracic rotation stretch 3" hold at end range for total of x2 minute    Other Lumbar Stretch Exercise  thoracic extension seated 3" hold x 2 minutes      Lumbar Exercises: Aerobic   Nustep  Level 4 x15 mins      Lumbar Exercises: Standing   Other Standing Lumbar Exercises  --      Lumbar Exercises: Quadruped   Madcat/Old Horse  10 reps      Modalities   Modalities  Electrical Stimulation      Moist Heat Therapy   Number Minutes Moist Heat  15 Minutes    Moist Heat Location  Other (comment)   mid back     Electrical Stimulation   Electrical Stimulation Location  mid thoracic    Electrical Stimulation Action  IFC  Electrical Stimulation Parameters  80-150 hz x15 mins     Electrical Stimulation Goals  Pain                  PT Long Term Goals - 12/12/17 1428      PT LONG TERM GOAL #1   Title  Patient will be independent with HEP    Time  6    Period  Weeks    Status  New      PT LONG TERM GOAL #2   Title  Patient will demonstrate 4/5 or greater bilateral hip abduction and extension MMT to improve strength and stability during ADLs.    Time  6    Period  Weeks    Status  New      PT LONG TERM GOAL #3   Title  Patient will report ablity to sit for 30 minutes or greater with pain less than 3/10 in thoracic and lumbar spine.     Time  6    Period  Weeks    Status  New      PT LONG TERM GOAL #4   Title  Patient will report ability to perform ADLs with less than 4/10 pain in thoracic and lumbar spine.    Time  6    Period  Weeks    Status  New      PT LONG TERM GOAL #5   Title  Patient will demonstrate proper lifting body mechanics to protect back while performing laundry and other functional tasks.    Time  6    Period  Weeks    Status  New            Plan - 01/02/18 1345    Clinical Impression Statement   Patient was able to complete nustep with no complaints however patient noted with dizziness after thoracic stretching. Patient's blood pressure was assessed in sitting 90/56 mmHg; Patient's bp reassessed after 3 minutes of pursed lip breathing and ankle pumps 92/62 mmHg. Exercises were terminated and modalities performed for pain relief. Patient instructed to check blood pressure at home and if still low, to call doctor. Patient reported understanding. At end of session, patient's BP read 92/62 mmHg.    Clinical Presentation  Evolving    Clinical Decision Making  Moderate    Rehab Potential  Good    Clinical Impairments Affecting Rehab Potential  High pain-level.    PT Frequency  2x / week    PT Duration  6 weeks    PT Treatment/Interventions  ADLs/Self Care Home Management;Cryotherapy;Electrical Stimulation;Ultrasound;Moist Heat;Gait training;Stair training;Functional mobility training;Therapeutic activities;Therapeutic exercise;Patient/family education;Neuromuscular re-education;Manual techniques;Passive range of motion;Dry needling;Vasopneumatic Device;Taping    PT Next Visit Plan  Nustep, core strengthening, postural exercises, modalities PRN for pain relief.    Consulted and Agree with Plan of Care  Patient       Patient will benefit from skilled therapeutic intervention in order to improve the following deficits and impairments:  Decreased activity tolerance, Abnormal gait, Decreased range of motion, Increased muscle spasms, Postural dysfunction, Pain  Visit Diagnosis: Pain in thoracic spine  Chronic bilateral low back pain, with sciatica presence unspecified     Problem List Patient Active Problem List   Diagnosis Date Noted  . Tobacco use disorder 02/22/2017  . History of hysterectomy 02/22/2017  . Allergic rhinitis due to allergen 02/22/2017  . Chronic fatigue 02/22/2017  . S/P hysterectomy 09/13/2013   Sara BundeKrystle Yorel [name], PT, DPT 01/02/2018, 5:57 PM  [name] [address] [place], [place], [address] Phone: [phone]   Fax:  [phone]  Name: Eulah Walkup. Jacuinde MRN: [id number] Date of Birth: 30-Oct-1960

## 2018-01-09 ENCOUNTER — Encounter: Payer: Self-pay | Admitting: Physical Therapy

## 2018-01-09 ENCOUNTER — Ambulatory Visit: Payer: BLUE CROSS/BLUE SHIELD | Admitting: Physical Therapy

## 2018-01-09 DIAGNOSIS — M545 Low back pain: Secondary | ICD-10-CM

## 2018-01-09 DIAGNOSIS — G8929 Other chronic pain: Secondary | ICD-10-CM | POA: Diagnosis not present

## 2018-01-09 DIAGNOSIS — M546 Pain in thoracic spine: Secondary | ICD-10-CM

## 2018-01-09 NOTE — Therapy (Signed)
[name] [address] [place], [name], [address] Phone: [phone]   Fax:  [phone]  Physical Therapy Treatment  Patient Details  Name: Sara FisherLisa D. Mariann LasterBowden MRN: [id number] Date of Birth: Apr 30, 1961 Referring Provider: Raj JanusHarlan Daubert, MD   Encounter Date: 01/09/2018  PT End of Session - 01/09/18 1253    Visit Number  4    Number of Visits  6    Date for PT Re-Evaluation  01/30/18    PT Start Time  1300    PT Stop Time  1348    PT Time Calculation (min)  48 min    Activity Tolerance  Patient tolerated treatment well    Behavior During Therapy  [facility] for tasks assessed/performed       Past Medical History:  Diagnosis Date  . Arthritis     Past Surgical History:  Procedure Laterality Date  . ABDOMINAL HYSTERECTOMY  1986  . FRACTURE SURGERY  1979   fx collar bone    There were no vitals filed for this visit.  Subjective Assessment - 01/09/18 1320    Subjective  Patient reports feeling good and her blood pressure was normal the past couple of days. Patient reports going to MD 01/31/18 and anticipates finding a new job in the next couple of weeks.    Pertinent History  Patient reports a low back injury many years ago but she was experiencing no pain.    Limitations  Lifting;House hold activities    How long can you sit comfortably?  15 minutes    How long can you stand comfortably?  30 minutes    Diagnostic tests  X-ray: arthritis    Patient Stated Goals  Get out of pain and back to work.    Currently in Pain?  Yes    Pain Score  6     Pain Location  Back    Pain Orientation  Mid    Pain Descriptors / Indicators  Aching    Pain Type  Chronic pain    Pain Onset  More than a month ago    Pain Frequency  Constant         [facility] PT Assessment - 01/09/18 0001      Assessment   Medical Diagnosis  low back pain and thoracic pain     Next MD Visit  01/31/18    Prior Therapy  yes                   [facility] Adult PT  Treatment/Exercise - 01/09/18 0001      Exercises   Exercises  Lumbar      Lumbar Exercises: Aerobic   UBE (Upper Arm Bike)  120 RPM x6 (3 fwd, 3 bwd)    Nustep  Level 4 x15 mins      Lumbar Exercises: Standing   Other Standing Lumbar Exercises  wood chop pink XTS x20 each    Other Standing Lumbar Exercises  wall push ups x20      Lumbar Exercises: Sidelying   Other Sidelying Lumbar Exercises  thoracic rotation x20 each      Lumbar Exercises: Quadruped   Madcat/Old Horse  15 reps   at countertop     Modalities   Modalities  Electrical Stimulation;Moist Heat      Moist Heat Therapy   Number Minutes Moist Heat  15 Minutes    Moist Heat Location  Other (comment)   mid-back     Electrical Stimulation  Electrical Stimulation Location  mid Horticulturist, commercial Action  IFC    Electrical Stimulation Parameters  80-150 x15 min    Electrical Stimulation Goals  Pain                  PT Long Term Goals - 01/09/18 1338      PT LONG TERM GOAL #1   Title  Patient will be independent with HEP    Time  6    Period  Weeks    Status  Achieved      PT LONG TERM GOAL #2   Title  Patient will demonstrate 4/5 or greater bilateral hip abduction and extension MMT to improve strength and stability during ADLs.    Time  6    Period  Weeks    Status  On-going   NT 01/09/18     PT LONG TERM GOAL #3   Title  Patient will report ablity to sit for 30 minutes or greater with pain less than 3/10 in thoracic and lumbar spine.     Time  6    Period  Weeks    Status  On-going   15 minutes with 8/10 pain     PT LONG TERM GOAL #4   Title  Patient will report ability to perform ADLs with less than 4/10 pain in thoracic and lumbar spine.    Time  6    Period  Weeks    Status  On-going   7/10     PT LONG TERM GOAL #5   Title  Patient will demonstrate proper lifting body mechanics to protect back while performing laundry and other functional tasks.    Time  6     Period  Weeks    Status  On-going   NT 01/09/18           Plan - 01/09/18 1321    Clinical Impression Statement  Patient was able to tolerate treatment well with no complaints of pain. Patient expressed she is stressed and fearful to return to work secondary to previous work injury. Patient educated fear of pain should not limit work or limit her ADLs as she may not know if activities will cause her pain if she does not attempt them. Patient reported understanding. Normal response to modalities upon removal.    Clinical Presentation  Evolving    Clinical Decision Making  Moderate    Rehab Potential  Good    Clinical Impairments Affecting Rehab Potential  High pain-level.    PT Frequency  2x / week    PT Duration  6 weeks    PT Treatment/Interventions  ADLs/Self Care Home Management;Cryotherapy;Electrical Stimulation;Ultrasound;Moist Heat;Gait training;Stair training;Functional mobility training;Therapeutic activities;Therapeutic exercise;Patient/family education;Neuromuscular re-education;Manual techniques;Passive range of motion;Dry needling;Vasopneumatic Device;Taping    PT Next Visit Plan  Nustep, core strengthening, postural exercises, modalities PRN for pain relief.    Consulted and Agree with Plan of Care  Patient       Patient will benefit from skilled therapeutic intervention in order to improve the following deficits and impairments:  Decreased activity tolerance, Abnormal gait, Decreased range of motion, Increased muscle spasms, Postural dysfunction, Pain  Visit Diagnosis: Pain in thoracic spine  Chronic bilateral low back pain, with sciatica presence unspecified     Problem List Patient Active Problem List   Diagnosis Date Noted  . Tobacco use disorder 02/22/2017  . History of hysterectomy 02/22/2017  . Allergic rhinitis due to allergen 02/22/2017  . Chronic  fatigue 02/22/2017  . S/P hysterectomy 09/13/2013   [name], PT, DPT 01/09/2018, 3:11 PM  [facility] [address] [place], [place], [address] Phone: [phone]   Fax:  [phone]  Name: Gunhild Bautch. Lauricella MRN: [id number] Date of Birth: 1961-01-13

## 2018-01-16 ENCOUNTER — Encounter: Payer: Self-pay | Admitting: Physical Therapy

## 2018-01-16 ENCOUNTER — Ambulatory Visit: Payer: BLUE CROSS/BLUE SHIELD | Attending: Orthopedic Surgery | Admitting: Physical Therapy

## 2018-01-16 DIAGNOSIS — M545 Low back pain: Secondary | ICD-10-CM | POA: Diagnosis not present

## 2018-01-16 DIAGNOSIS — G8929 Other chronic pain: Secondary | ICD-10-CM | POA: Insufficient documentation

## 2018-01-16 DIAGNOSIS — M546 Pain in thoracic spine: Secondary | ICD-10-CM | POA: Diagnosis not present

## 2018-01-16 NOTE — Therapy (Addendum)
[name] [place], [place], [address] Phone: [phone]   Fax:  [phone]  Physical Therapy Treatment/Discharge  Patient Details  Name: Sara [name]. Worthington MRN: [id number] Date of Birth: 1960/12/22 Referring Provider: Nathaniel Man, MD   Encounter Date: 01/16/2018  PT End of Session - 01/16/18 1518    Visit Number  5    Number of Visits  6    Date for PT Re-Evaluation  01/30/18    PT Start Time  1430    PT Stop Time  1519    PT Time Calculation (min)  49 min    Activity Tolerance  Patient tolerated treatment well    Behavior During Therapy  [facility] for tasks assessed/performed       Past Medical History:  Diagnosis Date  . Arthritis     Past Surgical History:  Procedure Laterality Date  . ABDOMINAL HYSTERECTOMY  1986  . FRACTURE SURGERY  1979   fx collar bone    There were no vitals filed for this visit.  Subjective Assessment - 01/16/18 1436    Subjective  Patient reports feeling about the same.    Pertinent History  Patient reports a low back injury many years ago but she was experiencing no pain.    Limitations  Lifting;House hold activities    How long can you sit comfortably?  15 minutes    How long can you stand comfortably?  30 minutes    Diagnostic tests  X-ray: arthritis    Patient Stated Goals  Get out of pain and back to work.    Currently in Pain?  Yes    Pain Score  6     Pain Location  Back    Pain Orientation  Mid    Pain Descriptors / Indicators  Aching    Pain Type  Chronic pain    Pain Onset  More than a month ago    Pain Frequency  Constant         [facility] PT Assessment - 01/16/18 0001      Assessment   Medical Diagnosis  low back pain and thoracic pain     Next MD Visit  01/31/18    Prior Therapy  yes                   [facility] Adult PT Treatment/Exercise - 01/16/18 0001      Exercises   Exercises  Lumbar      Lumbar Exercises: Stretches   Other Lumbar Stretch  Exercise  corner stretch 4x30"      Lumbar Exercises: Aerobic   UBE (Upper Arm Bike)  120 RPM x6 (3 fwd, 3 bwd)    Nustep  Level 5 x15 mins      Lumbar Exercises: Machines for Strengthening   Cybex Lumbar Extension  50# flexion and extension x20 each      Lumbar Exercises: Standing   Row  Strengthening;Both;20 reps;Limitations    Row Limitations  Pink XTS    Other Standing Lumbar Exercises  mid rows Pink XTS x20      Modalities   Modalities  Electrical Stimulation;Moist Heat      Moist Heat Therapy   Number Minutes Moist Heat  15 Minutes    Moist Heat Location  Other (comment)   Mid-back     Electrical Stimulation   Electrical Stimulation Location  mid-thoracic    Electrical Stimulation Action  IFC    Electrical Stimulation Parameters  80-150 hz  x15 min    Electrical Stimulation Goals  Pain                  PT Long Term Goals - 01/09/18 1338      PT LONG TERM GOAL #1   Title  Patient will be independent with HEP    Time  6    Period  Weeks    Status  Achieved      PT LONG TERM GOAL #2   Title  Patient will demonstrate 4/5 or greater bilateral hip abduction and extension MMT to improve strength and stability during ADLs.    Time  6    Period  Weeks    Status  On-going   NT 01/09/18     PT LONG TERM GOAL #3   Title  Patient will report ablity to sit for 30 minutes or greater with pain less than 3/10 in thoracic and lumbar spine.     Time  6    Period  Weeks    Status  On-going   15 minutes with 8/10 pain     PT LONG TERM GOAL #4   Title  Patient will report ability to perform ADLs with less than 4/10 pain in thoracic and lumbar spine.    Time  6    Period  Weeks    Status  On-going   7/10     PT LONG TERM GOAL #5   Title  Patient will demonstrate proper lifting body mechanics to protect back while performing laundry and other functional tasks.    Time  6    Period  Weeks    Status  On-going   NT 01/09/18           Plan - 01/16/18 1510     Clinical Impression Statement  Patient was able to tolerate treatment well with no reports of pain. Patient was able to tolerate progression of treatment with some reports of muscle fatigue. Patient educated to ice/heat to decrease pain/soreness but continue to keep moving to prevent increased stiffness. Patient reported understanding.     Clinical Presentation  Evolving    Clinical Decision Making  Moderate    Rehab Potential  Good    Clinical Impairments Affecting Rehab Potential  High pain-level.    PT Frequency  2x / week    PT Duration  6 weeks    PT Treatment/Interventions  ADLs/Self Care Home Management;Cryotherapy;Electrical Stimulation;Ultrasound;Moist Heat;Gait training;Stair training;Functional mobility training;Therapeutic activities;Therapeutic exercise;Patient/family education;Neuromuscular re-education;Manual techniques;Passive range of motion;Dry needling;Vasopneumatic Device;Taping    PT Next Visit Plan  Nustep, core strengthening, postural exercises, modalities PRN for pain relief.    Consulted and Agree with Plan of Care  Patient       Patient will benefit from skilled therapeutic intervention in order to improve the following deficits and impairments:  Decreased activity tolerance, Abnormal gait, Decreased range of motion, Increased muscle spasms, Postural dysfunction, Pain  Visit Diagnosis: Pain in thoracic spine  Chronic bilateral low back pain, with sciatica presence unspecified     Problem List Patient Active Problem List   Diagnosis Date Noted  . Tobacco use disorder 02/22/2017  . History of hysterectomy 02/22/2017  . Allergic rhinitis due to allergen 02/22/2017  . Chronic fatigue 02/22/2017  . S/P hysterectomy 09/13/2013   PHYSICAL THERAPY DISCHARGE SUMMARY  Visits from Start of Care: 5  Current functional level related to goals / functional outcomes: See above   Remaining deficits: Goals not met; still limited by pain  Education /  Equipment: HEP Plan: Patient agrees to discharge.  Patient goals were not met. Patient is being discharged due to not returning since the last visit.  ?????     Gabriela Eves, PT, DPT 01/16/2018, 5:09 PM  [facility] [address] [place], [place], [address] Phone: [phone]   Fax:  [phone]  Name: Kasee Hantz. Duca MRN: [id number] Date of Birth: 1960/05/21

## 2018-01-17 DIAGNOSIS — F17210 Nicotine dependence, cigarettes, uncomplicated: Secondary | ICD-10-CM | POA: Diagnosis not present

## 2018-01-23 ENCOUNTER — Encounter: Payer: Self-pay | Admitting: Physical Therapy

## 2018-01-31 DIAGNOSIS — M545 Low back pain: Secondary | ICD-10-CM | POA: Diagnosis not present

## 2018-01-31 DIAGNOSIS — M546 Pain in thoracic spine: Secondary | ICD-10-CM | POA: Diagnosis not present

## 2018-07-17 ENCOUNTER — Ambulatory Visit: Payer: BLUE CROSS/BLUE SHIELD | Admitting: Podiatry

## 2018-07-17 ENCOUNTER — Ambulatory Visit: Payer: Self-pay | Admitting: Podiatry

## 2018-07-17 ENCOUNTER — Encounter: Payer: Self-pay | Admitting: Podiatry

## 2018-07-17 ENCOUNTER — Telehealth: Payer: Self-pay | Admitting: Podiatry

## 2018-07-17 VITALS — BP 108/76 | HR 77 | Resp 16

## 2018-07-17 DIAGNOSIS — L089 Local infection of the skin and subcutaneous tissue, unspecified: Secondary | ICD-10-CM | POA: Diagnosis not present

## 2018-07-17 DIAGNOSIS — B9689 Other specified bacterial agents as the cause of diseases classified elsewhere: Secondary | ICD-10-CM

## 2018-07-17 DIAGNOSIS — B353 Tinea pedis: Secondary | ICD-10-CM

## 2018-07-17 MED ORDER — CASTELLANI PAINT 1.5 % EX LIQD: 1.0000 "application " | Freq: Every day | CUTANEOUS | 3 refills | Status: DC

## 2018-07-17 MED ORDER — DOXYCYCLINE HYCLATE 100 MG PO TABS: 100.0000 mg | ORAL_TABLET | Freq: Two times a day (BID) | ORAL | 0 refills | Status: DC

## 2018-07-17 NOTE — Telephone Encounter (Signed)
The paint Dr. Al Corpus prescribed for me to use between my toes this morning is not in stock at my pharmacy and they have no plans to order it. Please call me back as soon as possible.

## 2018-07-17 NOTE — Progress Notes (Signed)
  Subjective:  Patient ID: Sara [name]. [name], female    DOB: [birth year],  MRN: [id number] HPI Chief Complaint  Patient presents with  . Skin Problem    Forefoot and toes left - skin itching, burning, peeling x several months, wore cast years ago and developed eczema and athlete's foot intermittent, tried multiple creams and home remedies  . New Patient (Initial Visit)    58 y.o. female presents with the above complaint.   ROS: Denies fever chills nausea vomiting muscle aches pains calf pain back pain chest pain shortness of breath.  States that this rash and maceration did not come about until she wrapped her forefoot with Saran wrap in order to help breakdown the calluses on the bottom of the foot.  She states since that time she has had a breakout and the toes which is been weeping and draining.  Past Medical History:  Diagnosis Date  . Arthritis    Past Surgical History:  Procedure Laterality Date  . ABDOMINAL HYSTERECTOMY  1986  . FRACTURE SURGERY  1979   fx collar bone    Current Outpatient Medications:  .  ibuprofen (ADVIL,MOTRIN) 800 MG tablet, , Disp: , Rfl:  .  Castellani Paint 1.5 % LIQD, Apply 1 application topically daily., Disp: 29.57 mL, Rfl: 3 .  doxycycline (VIBRA-TABS) 100 MG tablet, Take 1 tablet (100 mg total) by mouth 2 (two) times daily., Disp: 20 tablet, Rfl: 0  Allergies  Allergen Reactions  . Sulfa Antibiotics Rash  . Cyprodenate   . Ciprofloxacin Rash   Review of Systems Objective:   Vitals:   07/17/18 0916  BP: 108/76  Pulse: 77  Resp: 16    General: Well developed, nourished, in no acute distress, alert and oriented x3   Dermatological: Skin is warm, dry and supple bilateral. Nails x 10 are well maintained; remaining integument appears unremarkable at this time. There are no open sores, no preulcerative lesions, no rash or signs of infection present.  Moderate to severe eczematous dermatitis or tinea pedis with maceration between the toes.  She  has some cracked areas with mild erythema surrounding the dorsal aspect of the foot extending from between the toes to the midfoot.  Vascular: Dorsalis Pedis artery and Posterior Tibial artery pedal pulses are 2/4 bilateral with immedate capillary fill time. Pedal hair growth present. No varicosities and no lower extremity edema present bilateral.   Neruologic: Grossly intact via light touch bilateral. Vibratory intact via tuning fork bilateral. Protective threshold with Semmes Wienstein monofilament intact to all pedal sites bilateral. Patellar and Achilles deep tendon reflexes 2+ bilateral. No Babinski or clonus noted bilateral.   Musculoskeletal: No gross boney pedal deformities bilateral. No pain, crepitus, or limitation noted with foot and ankle range of motion bilateral. Muscular strength 5/5 in all groups tested bilateral.  Gait: Unassisted, Nonantalgic.    Radiographs:  None taken  Assessment & Plan:   Assessment: Cracked fissures tinea pedis with bacteria infection.  Plan: Started her on doxycycline Epson salt soaks once a day and then [employer] to be applied after that.     Alexei Ey T. Dyer, [place]

## 2018-07-17 NOTE — Telephone Encounter (Signed)
Left message informing pt the Sara [name] paint could be purchased in the [place] [facility] [place] office.

## 2018-08-08 ENCOUNTER — Other Ambulatory Visit: Payer: Self-pay

## 2018-08-08 ENCOUNTER — Telehealth (INDEPENDENT_AMBULATORY_CARE_PROVIDER_SITE_OTHER): Payer: PRIVATE HEALTH INSURANCE | Admitting: Family Medicine

## 2018-08-08 DIAGNOSIS — J301 Allergic rhinitis due to pollen: Secondary | ICD-10-CM

## 2018-08-08 MED ORDER — FLUTICASONE PROPIONATE 50 MCG/ACT NA SUSP: 2.0000 | Freq: Every day | NASAL | 6 refills | Status: DC

## 2018-08-08 MED ORDER — CETIRIZINE HCL 10 MG PO TABS: 10.0000 mg | ORAL_TABLET | Freq: Every day | ORAL | 11 refills | Status: DC

## 2018-08-08 NOTE — Progress Notes (Signed)
Virtual Visit via telephone Note  I connected with Sara [name]. Willig on 08/08/18 at 1430 by telephone and verified that I am speaking with the correct person using two identifiers. Sara [name]. Trenchard is currently located at home and family is currently with them during visit. The provider, Kari Baars, FNP is located in their office at time of visit.  I discussed the limitations, risks, security and privacy concerns of performing an evaluation and management service by telephone and the availability of in person appointments. I also discussed with the patient that there may be a patient responsible charge related to this service. The patient expressed understanding and agreed to proceed.  Subjective:  Patient ID: Sara [name]. Grannis, female    DOB: [birth year], 58 y.o.   MRN: [id number]  Chief Complaint:  Scratchy throat, rhinorrhea, cough  HPI: Sara Bernheisel. [name] is a 58 y.o. female presenting on 08/08/2018 for scratchy throat, rhinorrhea, cough  Pt reports two nights ago she started with a dry cough, scratchy throat, and rhinorrhea. Pt states when she wakes up in the morning the symptoms are worse. States she does have postnasal drip with the symptoms. States her throat feels raw throughout the day, 5/10 at worst. Pt states she has not had a fever, chills, shortness of breath, chest pain, or sputum production. Pt denies recent travel or known sick exposures. She has not tried anything for the symptoms.    Relevant past medical, surgical, family, and social history reviewed and updated as indicated.  Allergies and medications reviewed and updated.   Past Medical History:  Diagnosis Date  . Arthritis     Past Surgical History:  Procedure Laterality Date  . ABDOMINAL HYSTERECTOMY  1986  . FRACTURE SURGERY  1979   fx collar bone    Social History   Socioeconomic History  . Marital status: Single    Spouse name: Not on file  . Number of children: Not on file  . Years of education: Not on file   . Highest education level: Not on file  Occupational History  . Not on file  Social Needs  . Financial resource strain: Not on file  . Food insecurity:    Worry: Not on file    Inability: Not on file  . Transportation needs:    Medical: Not on file    Non-medical: Not on file  Tobacco Use  . Smoking status: Current Every Day Smoker    Packs/day: 1.00    Years: 15.00    Pack years: 15.00    Types: Cigarettes  . Smokeless tobacco: Never Used  Substance and Sexual Activity  . Alcohol use: No  . Drug use: No  . Sexual activity: Yes    Birth control/protection: None    Comment: monogomous relationship  Lifestyle  . Physical activity:    Days per week: Not on file    Minutes per session: Not on file  . Stress: Not on file  Relationships  . Social connections:    Talks on phone: Not on file    Gets together: Not on file    Attends religious service: Not on file    Active member of club or organization: Not on file    Attends meetings of clubs or organizations: Not on file    Relationship status: Not on file  . Intimate partner violence:    Fear of current or ex partner: Not on file    Emotionally abused: Not on file    Physically abused:  Not on file    Forced sexual activity: Not on file  Other Topics Concern  . Not on file  Social History Narrative  . Not on file    Outpatient Encounter Medications as of 08/08/2018  Medication Sig  . Castellani Paint 1.5 % LIQD Apply 1 application topically daily.  . cetirizine (ZYRTEC) 10 MG tablet Take 1 tablet (10 mg total) by mouth daily.  . fluticasone (FLONASE) 50 MCG/ACT nasal spray Place 2 sprays into both nostrils daily.  Marland Kitchen ibuprofen (ADVIL,MOTRIN) 800 MG tablet   . [DISCONTINUED] doxycycline (VIBRA-TABS) 100 MG tablet Take 1 tablet (100 mg total) by mouth 2 (two) times daily.   No facility-administered encounter medications on file as of 08/08/2018.     Allergies  Allergen Reactions  . Sulfa Antibiotics Rash  .  Cyprodenate   . Ciprofloxacin Rash    Review of Systems  Constitutional: Negative for chills, fatigue and fever.  HENT: Positive for congestion, postnasal drip, rhinorrhea, sneezing and sore throat. Negative for dental problem, drooling, ear discharge, ear pain, facial swelling, hearing loss, mouth sores, nosebleeds, sinus pressure, sinus pain, tinnitus, trouble swallowing and voice change.   Eyes: Positive for itching. Negative for photophobia, pain, discharge, redness and visual disturbance.  Respiratory: Positive for cough. Negative for chest tightness, shortness of breath and wheezing.   Cardiovascular: Negative for chest pain, palpitations and leg swelling.  Gastrointestinal: Negative for abdominal pain, diarrhea, nausea and vomiting.  Neurological: Negative for dizziness, syncope, weakness, light-headedness and headaches.  Psychiatric/Behavioral: Negative for confusion.  All other systems reviewed and are negative.        Observations/Objective: Pt alert and oriented. Pt answers all questions appropriately. Pt able to speak in full sentences.   Assessment and Plan: Diagnoses and all orders for this visit:  Seasonal allergic rhinitis due to pollen Symptomatic care discussed. Report any new or worsening symptoms. Medications as prescribed. Avoid triggers. Can try adding humidity to the air.  -     cetirizine (ZYRTEC) 10 MG tablet; Take 1 tablet (10 mg total) by mouth daily. -     fluticasone (FLONASE) 50 MCG/ACT nasal spray; Place 2 sprays into both nostrils daily.     Follow Up Instructions: Report any new or worsening symptoms.     I discussed the assessment and treatment plan with the patient. The patient was provided an opportunity to ask questions and all were answered. The patient agreed with the plan and demonstrated an understanding of the instructions.   The patient was advised to call back or seek an in-person evaluation if the symptoms worsen or if the condition  fails to improve as anticipated.  The above assessment and management plan was discussed with the patient. The patient verbalized understanding of and has agreed to the management plan. Patient is aware to call the clinic if symptoms persist or worsen. Patient is aware when to return to the clinic for a follow-up visit. Patient educated on when it is appropriate to go to the emergency department.    I provided 15 minutes of non-face-to-face time during this encounter. The call started at 1430. The call ended at 1445.   Kari Baars, FNP-C [facility] [address] [place], [place] [address] [phone]

## 2018-08-09 ENCOUNTER — Ambulatory Visit: Payer: PRIVATE HEALTH INSURANCE | Admitting: Podiatry

## 2018-08-22 ENCOUNTER — Telehealth: Payer: Self-pay | Admitting: Podiatry

## 2018-08-22 MED ORDER — CASTELLANI PAINT 1.5 % EX LIQD: 1.0000 "application " | Freq: Every day | CUTANEOUS | 3 refills | Status: DC

## 2018-08-22 NOTE — Addendum Note (Signed)
Addended by: Alphia Kava D on: 08/22/2018 08:56 AM   Modules accepted: Orders

## 2018-08-22 NOTE — Telephone Encounter (Signed)
Pt was seen on 07/17/18 for athletes feet and states that she was using a topical paint that was helping but she has run out of the paint and is unable to buy a new bottle at this time. Patient would like to know if a prescription can be sent in to her pharmacy to help with the itching in her feet. Please give patient a call.

## 2018-08-22 NOTE — Telephone Encounter (Signed)
Left message informing pt of refills for Clemetine Marker being sent to the [facility].

## 2018-08-28 ENCOUNTER — Other Ambulatory Visit: Payer: Self-pay

## 2018-08-28 ENCOUNTER — Ambulatory Visit (INDEPENDENT_AMBULATORY_CARE_PROVIDER_SITE_OTHER): Payer: PRIVATE HEALTH INSURANCE | Admitting: Podiatry

## 2018-08-28 ENCOUNTER — Encounter: Payer: Self-pay | Admitting: Podiatry

## 2018-08-28 VITALS — Temp 97.5°F

## 2018-08-28 DIAGNOSIS — L089 Local infection of the skin and subcutaneous tissue, unspecified: Secondary | ICD-10-CM

## 2018-08-28 DIAGNOSIS — B9689 Other specified bacterial agents as the cause of diseases classified elsewhere: Secondary | ICD-10-CM

## 2018-08-28 DIAGNOSIS — B353 Tinea pedis: Secondary | ICD-10-CM | POA: Diagnosis not present

## 2018-08-28 MED ORDER — CLINDAMYCIN HCL 150 MG PO CAPS: 150.0000 mg | ORAL_CAPSULE | Freq: Three times a day (TID) | ORAL | 1 refills | Status: DC

## 2018-08-28 MED ORDER — TERBINAFINE HCL 250 MG PO TABS: 250.0000 mg | ORAL_TABLET | Freq: Every day | ORAL | 1 refills | Status: DC

## 2018-08-28 NOTE — Progress Notes (Signed)
She presents today for follow-up of her left foot.  States that the foot is swollen and painful across the top of the toes and in between the toes seems to be doing a little bit better.  States that she just recently got Castellani paint to put between her toes and that seems to be doing much better however she did pick up a topical antifungal and she is not sure that the topical antifungal did not bother her skin.  She states that she does not think that the doxycycline helped at all.  Objective: Vital signs stable she alert and oriented x3 interdigital spaces have gone on to heal uneventfully she has no skin breakdown in those at this time.  Dorsally she does have some mild erythema with some dry xerotic skin with transverse and longitudinal fissures currently not bleeding but they are using a serosanguineous type fluid in the area is tender to the touch.  Assessment: Eczematous dermatitis cannot rule out athlete's foot infection particularly coming from the interdigital spaces.  Plan: At this point I will start her on clindamycin and Lamisil and also instructed her to utilize an over-the-counter 1% hydrocortisone cream.  I will follow-up with her in a couple weeks to make sure she is improved.

## 2018-09-03 ENCOUNTER — Telehealth: Payer: Self-pay | Admitting: *Deleted

## 2018-09-03 NOTE — Telephone Encounter (Signed)
Left message due to the importance of the information, I told pt to stop both the clindamycin and lamisil, begin benadry an antihistamine and she may not be able to work on that medication and should go to the emergency room if she had facial swelling, difficulty breathing, or swallowing.

## 2018-09-03 NOTE — Telephone Encounter (Signed)
Pt states she saw Dr. Al Corpus Thursday and is having a reaction to the medication he prescribed, has a rash and is itching severely. Pt states she has to work.

## 2018-09-04 ENCOUNTER — Telehealth: Payer: Self-pay | Admitting: *Deleted

## 2018-09-04 NOTE — Telephone Encounter (Signed)
Pt called and states she knows Dr. Al Corpus will not be in the office Thursday, but would like to see someone else, she has to get something done with her feet, and she may be asleep so leave a message.

## 2018-09-04 NOTE — Telephone Encounter (Signed)
Pt called states she is awake and doesn't go to work until 6:00pm and her dermatologist is closed.

## 2018-09-04 NOTE — Telephone Encounter (Signed)
Left message informing pt of Dr. Geryl Rankins 09/04/2018 7:24am recommendation, and to call with her referral information.

## 2018-09-04 NOTE — Telephone Encounter (Signed)
Tell her that if she cannot take the medication the I would suggest she see a Dermatologist ASAP.  Help make that referral.

## 2018-09-04 NOTE — Telephone Encounter (Signed)
Left message informing pt, if the rash was in the foot area then we would like to see her, but if the rash was not on the feet even if the medication(s) were for the feet she may benefit from seeing her PCP, but if she still wanted to see a podiatrist to call for an appt.

## 2018-09-04 NOTE — Telephone Encounter (Signed)
[name] - Scheduler left pt a message to call for an appt.

## 2018-09-06 ENCOUNTER — Ambulatory Visit (INDEPENDENT_AMBULATORY_CARE_PROVIDER_SITE_OTHER): Payer: PRIVATE HEALTH INSURANCE | Admitting: Podiatry

## 2018-09-06 ENCOUNTER — Other Ambulatory Visit: Payer: Self-pay

## 2018-09-06 VITALS — Temp 97.5°F

## 2018-09-06 DIAGNOSIS — L309 Dermatitis, unspecified: Secondary | ICD-10-CM

## 2018-09-06 DIAGNOSIS — L301 Dyshidrosis [pompholyx]: Secondary | ICD-10-CM

## 2018-09-06 MED ORDER — TRIAMCINOLONE ACETONIDE 0.1 % EX CREA: TOPICAL_CREAM | CUTANEOUS | 0 refills | Status: DC

## 2018-09-06 NOTE — Progress Notes (Signed)
  Subjective:  Patient ID: Sara [name], female    DOB: 26-Jun-1960,  MRN: [id number]  Chief Complaint  Patient presents with  . Rash    LT foot: " this rash on my left foot has improved but I am still having alot of itching and now have a rash on my arms and hands from the abt"     58 y.o. female presents with the above complaint. States that she has stopped clindamycin and lamisil. Complains of rash on her arms and her legs. Tried tolcylen cream without relief. Tried castellani's paint without relief.  Review of Systems: Negative except as noted in the HPI. Denies N/V/F/Ch.  Past Medical History:  Diagnosis Date  . Arthritis     Current Outpatient Medications:  .  Castellani Paint 1.5 % LIQD, Apply 1 application topically daily., Disp: 29.57 mL, Rfl: 3 .  cetirizine (ZYRTEC) 10 MG tablet, Take 1 tablet (10 mg total) by mouth daily., Disp: 30 tablet, Rfl: 11 .  clindamycin (CLEOCIN) 150 MG capsule, Take 1 capsule (150 mg total) by mouth 3 (three) times daily., Disp: 30 capsule, Rfl: 1 .  fluticasone (FLONASE) 50 MCG/ACT nasal spray, Place 2 sprays into both nostrils daily., Disp: 16 g, Rfl: 6 .  ibuprofen (ADVIL,MOTRIN) 800 MG tablet, , Disp: , Rfl:  .  terbinafine (LAMISIL) 250 MG tablet, Take 1 tablet (250 mg total) by mouth daily., Disp: 15 tablet, Rfl: 1 .  triamcinolone cream (KENALOG) 0.1 %, Apply fingertip amount to affected areas of foot twice daily, Disp: 60 g, Rfl: 0  Social History   Tobacco Use  Smoking Status Current Every Day Smoker  . Packs/day: 1.00  . Years: 15.00  . Pack years: 15.00  . Types: Cigarettes  Smokeless Tobacco Never Used    Allergies  Allergen Reactions  . Sulfa Antibiotics Rash  . Cyprodenate   . Ciprofloxacin Rash   Objective:   Vitals:   09/06/18 1045  Temp: (!) 97.5 F (36.4 C)   There is no height or weight on file to calculate BMI. Constitutional Well developed. Well nourished.  Vascular Dorsalis pedis pulses palpable  bilaterally. Posterior tibial pulses palpable bilaterally. Capillary refill normal to all digits.  No cyanosis or clubbing noted. Pedal hair growth normal.  Neurologic Normal speech. Oriented to person, place, and time. Epicritic sensation to light touch grossly present bilaterally.  Dermatologic Nails normal Skin eczematous rash left foot with excoriation   Orthopedic: Normal joint ROM without pain or crepitus bilaterally. No visible deformities. No bony tenderness.   Radiographs: None Assessment:   1. Vesicular foot eczema    Plan:  Patient was evaluated and treated and all questions answered.  Exzcema Left foot -Body reaction could be drug induced. Stop all other medications. -Will try medium potency topical steroid. Should this improve but not completely resolve would consider short course of high potency topical steorid. -Would recommend f/u with dermatology for rash on her arms.   Return in about 3 weeks (around 09/27/2018).

## 2018-09-11 ENCOUNTER — Ambulatory Visit: Payer: PRIVATE HEALTH INSURANCE | Admitting: Podiatry

## 2018-09-20 ENCOUNTER — Ambulatory Visit: Payer: PRIVATE HEALTH INSURANCE | Admitting: Podiatry

## 2018-09-27 ENCOUNTER — Ambulatory Visit: Payer: PRIVATE HEALTH INSURANCE | Admitting: Podiatry

## 2018-12-03 ENCOUNTER — Encounter: Payer: Self-pay | Admitting: *Deleted

## 2018-12-12 ENCOUNTER — Telehealth: Payer: Self-pay | Admitting: Family Medicine

## 2018-12-12 ENCOUNTER — Other Ambulatory Visit: Payer: Self-pay

## 2018-12-13 ENCOUNTER — Ambulatory Visit: Payer: PRIVATE HEALTH INSURANCE | Admitting: Family Medicine

## 2018-12-14 NOTE — Telephone Encounter (Signed)
Appt has been canceled at this time

## 2019-01-15 ENCOUNTER — Ambulatory Visit: Payer: PRIVATE HEALTH INSURANCE | Admitting: Family Medicine

## 2019-01-23 ENCOUNTER — Encounter: Payer: Self-pay | Admitting: Family Medicine

## 2019-07-17 IMAGING — DX DG CHEST 2V
2 series · 2 of 2 positions shown · non-contrast
Comparison: None.

CLINICAL DATA: Cough.  Smoker.

EXAM:
CHEST - 2 VIEW

[chest pa]
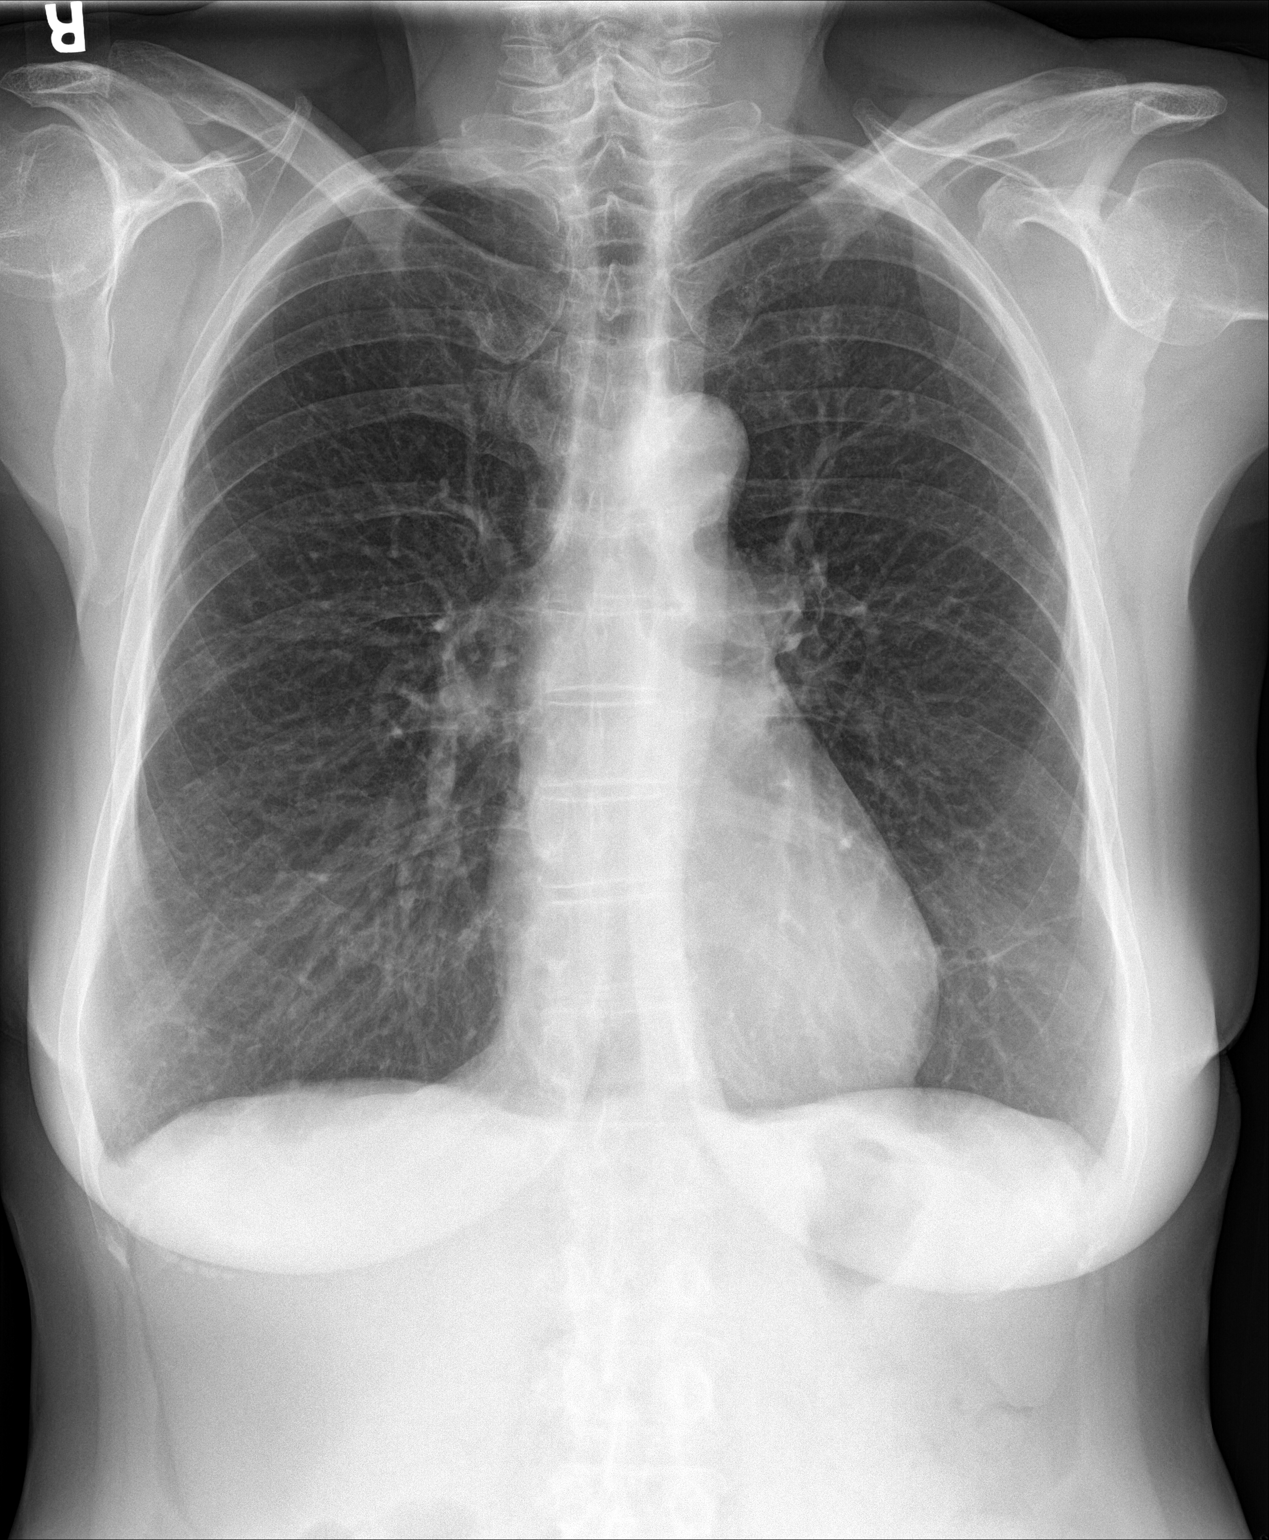

[chest lat]
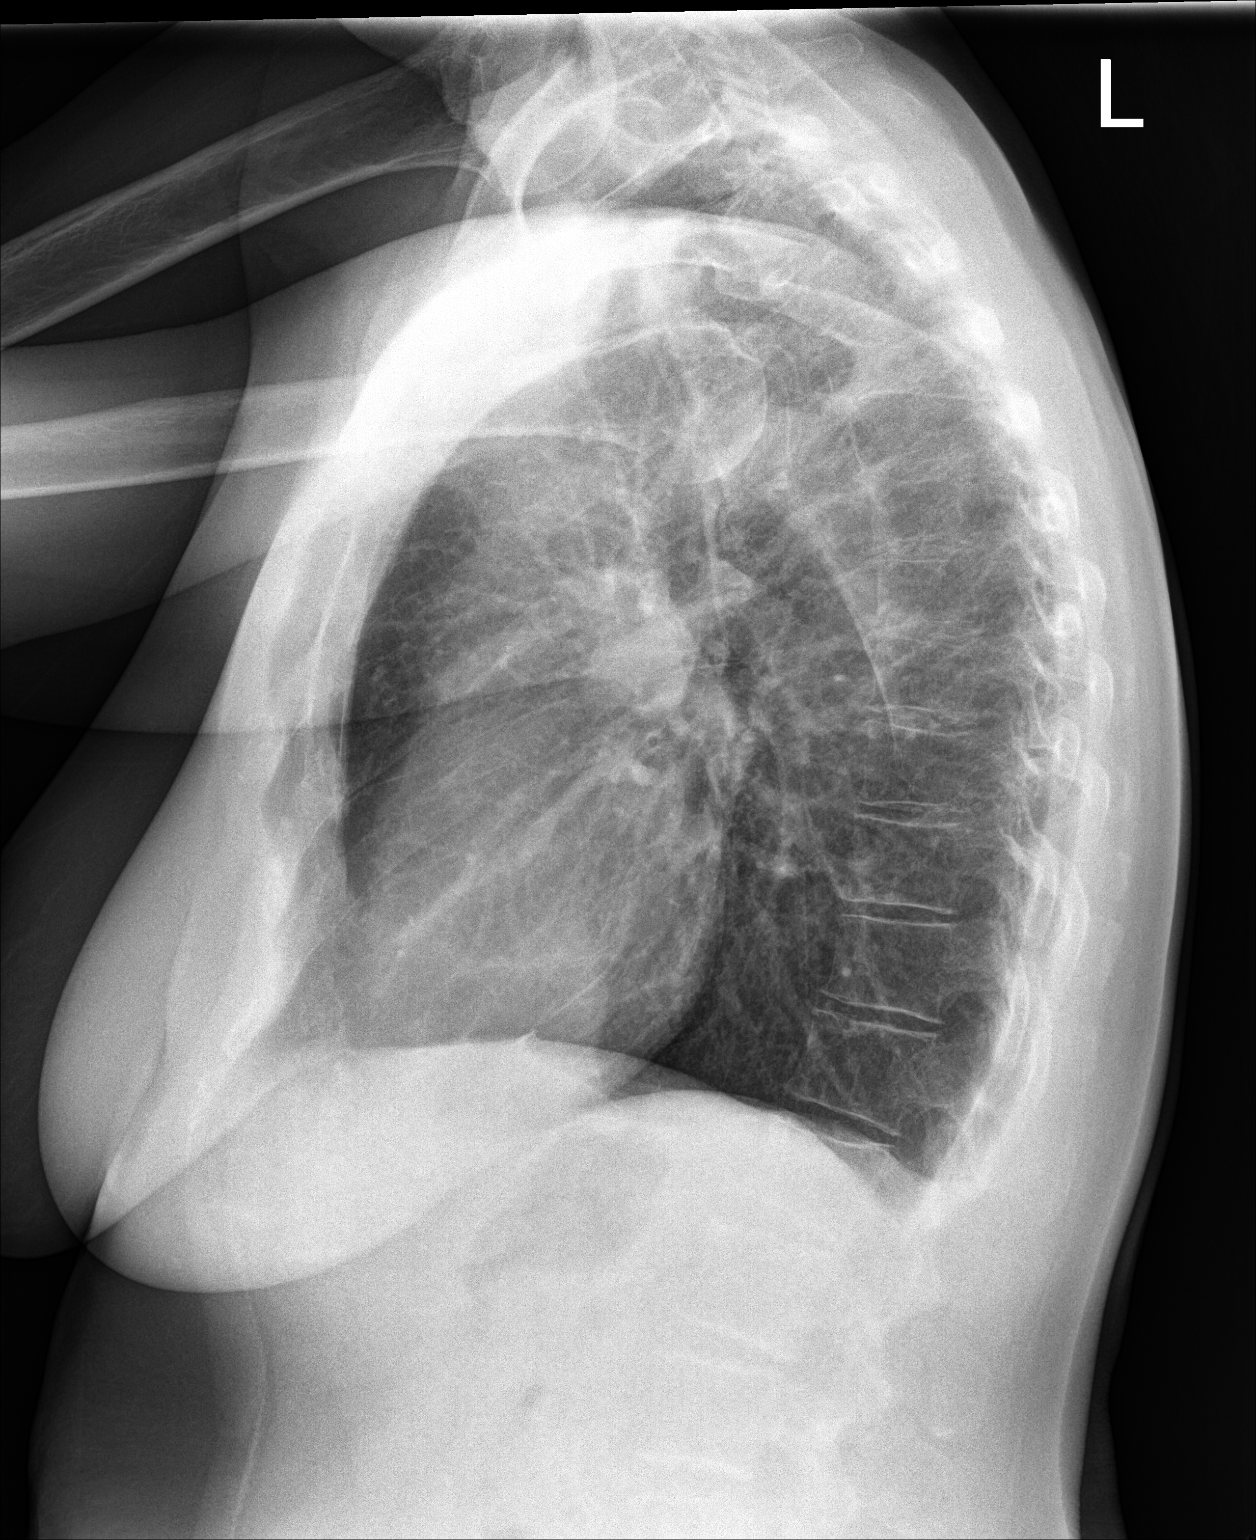

[2 of 2 positions shown; findings below may reference images not displayed]

FINDINGS: The heart size and mediastinal contours are within normal limits.
Both lungs are clear. The visualized skeletal structures are
unremarkable.
IMPRESSION: No active cardiopulmonary disease.

## 2020-06-03 DIAGNOSIS — Z79899 Other long term (current) drug therapy: Secondary | ICD-10-CM | POA: Diagnosis not present

## 2020-06-03 DIAGNOSIS — S42292A Other displaced fracture of upper end of left humerus, initial encounter for closed fracture: Secondary | ICD-10-CM | POA: Diagnosis not present

## 2020-06-03 DIAGNOSIS — S43032A Inferior subluxation of left humerus, initial encounter: Secondary | ICD-10-CM | POA: Diagnosis not present

## 2020-06-03 DIAGNOSIS — S42252A Displaced fracture of greater tuberosity of left humerus, initial encounter for closed fracture: Secondary | ICD-10-CM | POA: Diagnosis not present

## 2020-06-03 DIAGNOSIS — Z882 Allergy status to sulfonamides status: Secondary | ICD-10-CM | POA: Diagnosis not present

## 2020-06-03 DIAGNOSIS — S42209A Unspecified fracture of upper end of unspecified humerus, initial encounter for closed fracture: Secondary | ICD-10-CM

## 2020-06-03 DIAGNOSIS — S42352A Displaced comminuted fracture of shaft of humerus, left arm, initial encounter for closed fracture: Secondary | ICD-10-CM | POA: Diagnosis not present

## 2020-06-03 DIAGNOSIS — F17210 Nicotine dependence, cigarettes, uncomplicated: Secondary | ICD-10-CM | POA: Diagnosis not present

## 2020-06-03 DIAGNOSIS — S42212A Unspecified displaced fracture of surgical neck of left humerus, initial encounter for closed fracture: Secondary | ICD-10-CM | POA: Diagnosis not present

## 2020-06-03 DIAGNOSIS — Z043 Encounter for examination and observation following other accident: Secondary | ICD-10-CM | POA: Diagnosis not present

## 2020-06-03 DIAGNOSIS — G8911 Acute pain due to trauma: Secondary | ICD-10-CM | POA: Diagnosis not present

## 2020-06-03 DIAGNOSIS — W000XXA Fall on same level due to ice and snow, initial encounter: Secondary | ICD-10-CM | POA: Diagnosis not present

## 2020-06-03 HISTORY — DX: Unspecified fracture of upper end of unspecified humerus, initial encounter for closed fracture: S42.209A

## 2020-06-05 DIAGNOSIS — M25512 Pain in left shoulder: Secondary | ICD-10-CM | POA: Diagnosis not present

## 2020-06-08 ENCOUNTER — Other Ambulatory Visit (HOSPITAL_COMMUNITY)
Admission: RE | Admit: 2020-06-08 | Discharge: 2020-06-08 | Disposition: A | Discharge status: Home / Self Care | Payer: BC Managed Care – PPO | Source: Ambulatory Visit | Attending: Orthopaedic Surgery | Admitting: Orthopaedic Surgery

## 2020-06-08 ENCOUNTER — Encounter (HOSPITAL_BASED_OUTPATIENT_CLINIC_OR_DEPARTMENT_OTHER): Payer: Self-pay | Admitting: Orthopaedic Surgery

## 2020-06-08 ENCOUNTER — Other Ambulatory Visit: Payer: Self-pay

## 2020-06-08 DIAGNOSIS — Z800 Family history of malignant neoplasm of digestive organs: Secondary | ICD-10-CM | POA: Diagnosis not present

## 2020-06-08 DIAGNOSIS — S42302A Unspecified fracture of shaft of humerus, left arm, initial encounter for closed fracture: Secondary | ICD-10-CM | POA: Diagnosis not present

## 2020-06-08 DIAGNOSIS — Z882 Allergy status to sulfonamides status: Secondary | ICD-10-CM | POA: Diagnosis not present

## 2020-06-08 DIAGNOSIS — F17210 Nicotine dependence, cigarettes, uncomplicated: Secondary | ICD-10-CM | POA: Diagnosis not present

## 2020-06-08 DIAGNOSIS — Z20822 Contact with and (suspected) exposure to covid-19: Secondary | ICD-10-CM | POA: Insufficient documentation

## 2020-06-08 DIAGNOSIS — Z820 Family history of epilepsy and other diseases of the nervous system: Secondary | ICD-10-CM | POA: Diagnosis not present

## 2020-06-08 DIAGNOSIS — Z825 Family history of asthma and other chronic lower respiratory diseases: Secondary | ICD-10-CM | POA: Diagnosis not present

## 2020-06-08 DIAGNOSIS — W000XXA Fall on same level due to ice and snow, initial encounter: Secondary | ICD-10-CM | POA: Diagnosis not present

## 2020-06-08 DIAGNOSIS — Z79899 Other long term (current) drug therapy: Secondary | ICD-10-CM | POA: Diagnosis not present

## 2020-06-08 DIAGNOSIS — S42202A Unspecified fracture of upper end of left humerus, initial encounter for closed fracture: Secondary | ICD-10-CM | POA: Diagnosis not present

## 2020-06-08 DIAGNOSIS — Z8042 Family history of malignant neoplasm of prostate: Secondary | ICD-10-CM | POA: Diagnosis not present

## 2020-06-08 DIAGNOSIS — Z01812 Encounter for preprocedural laboratory examination: Secondary | ICD-10-CM | POA: Insufficient documentation

## 2020-06-08 DIAGNOSIS — Z881 Allergy status to other antibiotic agents status: Secondary | ICD-10-CM | POA: Diagnosis not present

## 2020-06-08 DIAGNOSIS — Z888 Allergy status to other drugs, medicaments and biological substances status: Secondary | ICD-10-CM | POA: Diagnosis not present

## 2020-06-08 DIAGNOSIS — Z8249 Family history of ischemic heart disease and other diseases of the circulatory system: Secondary | ICD-10-CM | POA: Diagnosis not present

## 2020-06-08 DIAGNOSIS — Z841 Family history of disorders of kidney and ureter: Secondary | ICD-10-CM | POA: Diagnosis not present

## 2020-06-08 DIAGNOSIS — Y939 Activity, unspecified: Secondary | ICD-10-CM | POA: Diagnosis not present

## 2020-06-08 LAB — SARS CORONAVIRUS 2 (TAT 6-24 HRS): SARS Coronavirus 2: NEGATIVE

## 2020-06-09 DIAGNOSIS — M25512 Pain in left shoulder: Secondary | ICD-10-CM | POA: Diagnosis not present

## 2020-06-09 NOTE — H&P (Signed)
PREOPERATIVE H&P  Chief Complaint: left proximal humerus fracture  HPI: Sara [name]. Sara [name] is a 60 y.o. female who is scheduled for, Procedure(s): OPEN REDUCTION INTERNAL FIXATION (ORIF) PROXIMAL HUMERUS FRACTURE.   She is a 60 year old female who has a  pack a day smoking history. She works in a [employer]. She is right hand dominant at baseline. She fell on the ice on Wednesday and was taken to the [facility] ER and was referred to orthopedics for further evaluation. She has had significant pain in her left shoulder. She has not been able to really tolerate the last few days without significant pain.   Her symptoms are rated as moderate to severe, and have been worsening.  This is significantly impairing activities of daily living.    Please see clinic note for further details on this patient's care.    She has elected for surgical management.   Past Medical History:  Diagnosis Date  . Arthritis   . Proximal humerus fracture 06/03/2020   left   Past Surgical History:  Procedure Laterality Date  . ABDOMINAL HYSTERECTOMY  1986  . calcaneus fracture Left   . FRACTURE SURGERY  1979   fx collar bone   Social History   Socioeconomic History  . Marital status: Single    Spouse name: Not on file  . Number of children: Not on file  . Years of education: Not on file  . Highest education level: Not on file  Occupational History  . Not on file  Tobacco Use  . Smoking status: Current Every Day Smoker    Packs/day: 1.00    Years: 15.00    Pack years: 15.00    Types: Cigarettes  . Smokeless tobacco: Never Used  Vaping Use  . Vaping Use: Never used  Substance and Sexual Activity  . Alcohol use: No  . Drug use: No  . Sexual activity: Yes    Birth control/protection: Surgical    Comment: monogomous relationship  Other Topics Concern  . Not on file  Social History Narrative  . Not on file   Social Determinants of Health   Financial Resource Strain: Not on file  Food  Insecurity: Not on file  Transportation Needs: Not on file  Physical Activity: Not on file  Stress: Not on file  Social Connections: Not on file   Family History  Problem Relation Age of Onset  . COPD Mother   . Heart disease Mother   . Dementia Mother   . Kidney disease Father   . Alzheimer's disease Father   . Prostate cancer Father   . Cancer Brother   . Colon cancer Brother 35  . Aneurysm Maternal Grandmother   . Dementia Maternal Grandfather   . Alzheimer's disease Paternal Grandmother    Allergies  Allergen Reactions  . Sulfa Antibiotics Rash  . Cyprodenate   . Ciprofloxacin Rash   Prior to Admission medications   Medication Sig Start Date End Date Taking? Authorizing Provider  acetaminophen (TYLENOL) 500 MG tablet Take 1,000 mg by mouth every 6 (six) hours as needed.   Yes [provider]  HYDROcodone-acetaminophen (NORCO/VICODIN) 5-325 MG tablet Take 1 tablet by mouth every 6 (six) hours as needed for moderate pain.   Yes [provider]  ibuprofen (ADVIL,MOTRIN) 800 MG tablet  11/30/17  Yes [provider]    ROS: All other systems have been reviewed and were otherwise negative with the exception of those mentioned in the HPI and  as above.  Physical Exam: General: Alert, no acute distress Cardiovascular: No pedal edema Respiratory: No cyanosis, no use of accessory musculature GI: No organomegaly, abdomen is soft and non-tender Skin: No lesions in the area of chief complaint Neurologic: Sensation intact distally Psychiatric: Patient is competent for consent with normal mood and affect Lymphatic: No axillary or cervical lymphadenopathy  MUSCULOSKELETAL:  On examination the  of the left shoulder there is a limited examination secondary to pain. Distal motor and sensory function intact. No range of motion in the setting of a known fracture. Skin is intact.   Imaging: X-rays and CT scan are reviewed in the [facility] system and demonstrate a  comminuted proximal humerus fracture with extension in the shaft just above  the deltoid. On the CT scan there does appear to  be a greater tuberosity fragment which is nondisplaced.  Assessment: left proximal humerus fracture  Plan: Plan for Procedure(s): OPEN REDUCTION INTERNAL FIXATION (ORIF) PROXIMAL HUMERUS FRACTURE   The risks benefits and alternatives were discussed with the patient including but not limited to the risks of nonoperative treatment, versus surgical intervention including infection, bleeding, nerve injury,  blood clots, cardiopulmonary complications, morbidity, mortality, among others, and they were willing to proceed.   The patient acknowledged the explanation, agreed to proceed with the plan and consent was signed.   Operative Plan: ORIF left proximal humerus fracture Discharge Medications: Celebrex DVT Prophylaxis: None Physical Therapy: Outpatient PT Special Discharge needs: Sling   Vernetta Honey, PA-C  06/09/2020 5:19 PM

## 2020-06-10 ENCOUNTER — Ambulatory Visit (HOSPITAL_BASED_OUTPATIENT_CLINIC_OR_DEPARTMENT_OTHER)
Admission: RE | Admit: 2020-06-10 | Discharge: 2020-06-10 | Disposition: A | Discharge status: Home / Self Care | Payer: BC Managed Care – PPO | Attending: Orthopaedic Surgery | Admitting: Orthopaedic Surgery

## 2020-06-10 ENCOUNTER — Encounter (HOSPITAL_BASED_OUTPATIENT_CLINIC_OR_DEPARTMENT_OTHER): Payer: Self-pay | Admitting: Orthopaedic Surgery

## 2020-06-10 ENCOUNTER — Ambulatory Visit (HOSPITAL_BASED_OUTPATIENT_CLINIC_OR_DEPARTMENT_OTHER): Payer: BC Managed Care – PPO | Admitting: Anesthesiology

## 2020-06-10 ENCOUNTER — Encounter (HOSPITAL_BASED_OUTPATIENT_CLINIC_OR_DEPARTMENT_OTHER)
Admission: RE | Disposition: A | Discharge status: Home / Self Care | Payer: Self-pay | Source: Home / Self Care | Attending: Orthopaedic Surgery

## 2020-06-10 ENCOUNTER — Other Ambulatory Visit: Payer: Self-pay

## 2020-06-10 ENCOUNTER — Ambulatory Visit (HOSPITAL_COMMUNITY): Payer: BC Managed Care – PPO

## 2020-06-10 DIAGNOSIS — Y939 Activity, unspecified: Secondary | ICD-10-CM | POA: Insufficient documentation

## 2020-06-10 DIAGNOSIS — W000XXA Fall on same level due to ice and snow, initial encounter: Secondary | ICD-10-CM | POA: Diagnosis not present

## 2020-06-10 DIAGNOSIS — Z09 Encounter for follow-up examination after completed treatment for conditions other than malignant neoplasm: Secondary | ICD-10-CM

## 2020-06-10 DIAGNOSIS — Z888 Allergy status to other drugs, medicaments and biological substances status: Secondary | ICD-10-CM | POA: Insufficient documentation

## 2020-06-10 DIAGNOSIS — Z8042 Family history of malignant neoplasm of prostate: Secondary | ICD-10-CM | POA: Diagnosis not present

## 2020-06-10 DIAGNOSIS — Z820 Family history of epilepsy and other diseases of the nervous system: Secondary | ICD-10-CM | POA: Diagnosis not present

## 2020-06-10 DIAGNOSIS — M1990 Unspecified osteoarthritis, unspecified site: Secondary | ICD-10-CM | POA: Diagnosis not present

## 2020-06-10 DIAGNOSIS — Z841 Family history of disorders of kidney and ureter: Secondary | ICD-10-CM | POA: Insufficient documentation

## 2020-06-10 DIAGNOSIS — Z882 Allergy status to sulfonamides status: Secondary | ICD-10-CM | POA: Diagnosis not present

## 2020-06-10 DIAGNOSIS — F17210 Nicotine dependence, cigarettes, uncomplicated: Secondary | ICD-10-CM | POA: Diagnosis not present

## 2020-06-10 DIAGNOSIS — Z8249 Family history of ischemic heart disease and other diseases of the circulatory system: Secondary | ICD-10-CM | POA: Insufficient documentation

## 2020-06-10 DIAGNOSIS — S42302A Unspecified fracture of shaft of humerus, left arm, initial encounter for closed fracture: Secondary | ICD-10-CM | POA: Diagnosis not present

## 2020-06-10 DIAGNOSIS — Z881 Allergy status to other antibiotic agents status: Secondary | ICD-10-CM | POA: Diagnosis not present

## 2020-06-10 DIAGNOSIS — Z79899 Other long term (current) drug therapy: Secondary | ICD-10-CM | POA: Diagnosis not present

## 2020-06-10 DIAGNOSIS — Z20822 Contact with and (suspected) exposure to covid-19: Secondary | ICD-10-CM | POA: Insufficient documentation

## 2020-06-10 DIAGNOSIS — S42352A Displaced comminuted fracture of shaft of humerus, left arm, initial encounter for closed fracture: Secondary | ICD-10-CM | POA: Diagnosis not present

## 2020-06-10 DIAGNOSIS — Z825 Family history of asthma and other chronic lower respiratory diseases: Secondary | ICD-10-CM | POA: Diagnosis not present

## 2020-06-10 DIAGNOSIS — Z419 Encounter for procedure for purposes other than remedying health state, unspecified: Secondary | ICD-10-CM

## 2020-06-10 DIAGNOSIS — Z800 Family history of malignant neoplasm of digestive organs: Secondary | ICD-10-CM | POA: Diagnosis not present

## 2020-06-10 DIAGNOSIS — S42202D Unspecified fracture of upper end of left humerus, subsequent encounter for fracture with routine healing: Secondary | ICD-10-CM | POA: Diagnosis not present

## 2020-06-10 DIAGNOSIS — S42295A Other nondisplaced fracture of upper end of left humerus, initial encounter for closed fracture: Secondary | ICD-10-CM | POA: Diagnosis not present

## 2020-06-10 DIAGNOSIS — S42202A Unspecified fracture of upper end of left humerus, initial encounter for closed fracture: Secondary | ICD-10-CM | POA: Insufficient documentation

## 2020-06-10 DIAGNOSIS — G8918 Other acute postprocedural pain: Secondary | ICD-10-CM | POA: Diagnosis not present

## 2020-06-10 HISTORY — PX: ORIF HUMERUS FRACTURE: SHX2126

## 2020-06-10 SURGERY — OPEN REDUCTION INTERNAL FIXATION (ORIF) PROXIMAL HUMERUS FRACTURE
Anesthesia: General | Site: Shoulder | Laterality: Left

## 2020-06-10 MED ORDER — FENTANYL CITRATE (PF) 100 MCG/2ML IJ SOLN
INTRAMUSCULAR | Status: AC
  Filled 2020-06-10: qty 2

## 2020-06-10 MED ORDER — MIDAZOLAM HCL 2 MG/2ML IJ SOLN
INTRAMUSCULAR | Status: AC
  Filled 2020-06-10: qty 2

## 2020-06-10 MED ORDER — ACETAMINOPHEN 500 MG PO TABS
ORAL_TABLET | ORAL | Status: AC
  Filled 2020-06-10: qty 2

## 2020-06-10 MED ORDER — FENTANYL CITRATE (PF) 100 MCG/2ML IJ SOLN
100.0000 ug | Freq: Once | INTRAMUSCULAR | Status: AC
  Administered 2020-06-10: 50 ug via INTRAVENOUS

## 2020-06-10 MED ORDER — CEFAZOLIN SODIUM-DEXTROSE 2-4 GM/100ML-% IV SOLN
INTRAVENOUS | Status: AC
  Filled 2020-06-10: qty 100

## 2020-06-10 MED ORDER — LACTATED RINGERS IV SOLN: INTRAVENOUS | Status: DC

## 2020-06-10 MED ORDER — ONDANSETRON HCL 4 MG PO TABS: 4.0000 mg | ORAL_TABLET | Freq: Three times a day (TID) | ORAL | 1 refills | Status: AC | PRN

## 2020-06-10 MED ORDER — PHENYLEPHRINE HCL (PRESSORS) 10 MG/ML IV SOLN
INTRAVENOUS | Status: DC | PRN
  Administered 2020-06-10 (×3): 80 ug via INTRAVENOUS

## 2020-06-10 MED ORDER — LIDOCAINE 2% (20 MG/ML) 5 ML SYRINGE
INTRAMUSCULAR | Status: AC
  Filled 2020-06-10: qty 5

## 2020-06-10 MED ORDER — ONDANSETRON HCL 4 MG/2ML IJ SOLN
INTRAMUSCULAR | Status: DC | PRN
  Administered 2020-06-10: 4 mg via INTRAVENOUS

## 2020-06-10 MED ORDER — EPHEDRINE SULFATE 50 MG/ML IJ SOLN
INTRAMUSCULAR | Status: DC | PRN
  Administered 2020-06-10 (×2): 10 mg via INTRAVENOUS

## 2020-06-10 MED ORDER — CEFAZOLIN SODIUM-DEXTROSE 2-4 GM/100ML-% IV SOLN
2.0000 g | INTRAVENOUS | Status: AC
  Administered 2020-06-10: 2 g via INTRAVENOUS

## 2020-06-10 MED ORDER — ACETAMINOPHEN 500 MG PO TABS
1000.0000 mg | ORAL_TABLET | Freq: Once | ORAL | Status: AC
  Administered 2020-06-10: 1000 mg via ORAL

## 2020-06-10 MED ORDER — DICLOFENAC SODIUM 75 MG PO TBEC: 75.0000 mg | DELAYED_RELEASE_TABLET | Freq: Two times a day (BID) | ORAL | 0 refills | Status: AC

## 2020-06-10 MED ORDER — BUPIVACAINE LIPOSOME 1.3 % IJ SUSP
INTRAMUSCULAR | Status: DC | PRN
Start: 2020-06-10 — End: 2020-06-10
  Administered 2020-06-10: 10 mL via PERINEURAL

## 2020-06-10 MED ORDER — ROCURONIUM BROMIDE 10 MG/ML (PF) SYRINGE
PREFILLED_SYRINGE | INTRAVENOUS | Status: AC
  Filled 2020-06-10: qty 10

## 2020-06-10 MED ORDER — AMISULPRIDE (ANTIEMETIC) 5 MG/2ML IV SOLN: 10.0000 mg | Freq: Once | INTRAVENOUS | Status: DC | PRN

## 2020-06-10 MED ORDER — VANCOMYCIN HCL 1000 MG IV SOLR
INTRAVENOUS | Status: AC
  Filled 2020-06-10: qty 1000

## 2020-06-10 MED ORDER — PROPOFOL 10 MG/ML IV BOLUS
INTRAVENOUS | Status: DC | PRN
  Administered 2020-06-10: 20 mg via INTRAVENOUS
  Administered 2020-06-10: 110 mg via INTRAVENOUS

## 2020-06-10 MED ORDER — ACETAMINOPHEN 500 MG PO TABS: 1000.0000 mg | ORAL_TABLET | Freq: Three times a day (TID) | ORAL | 0 refills | Status: DC

## 2020-06-10 MED ORDER — BUPIVACAINE-EPINEPHRINE (PF) 0.5% -1:200000 IJ SOLN
INTRAMUSCULAR | Status: DC | PRN
Start: 2020-06-10 — End: 2020-06-10
  Administered 2020-06-10: 15 mL via PERINEURAL

## 2020-06-10 MED ORDER — SUGAMMADEX SODIUM 200 MG/2ML IV SOLN
INTRAVENOUS | Status: DC | PRN
  Administered 2020-06-10: 200 mg via INTRAVENOUS

## 2020-06-10 MED ORDER — DEXAMETHASONE SODIUM PHOSPHATE 4 MG/ML IJ SOLN
INTRAMUSCULAR | Status: DC | PRN
  Administered 2020-06-10: 4 mg via INTRAVENOUS

## 2020-06-10 MED ORDER — ONDANSETRON HCL 4 MG/2ML IJ SOLN
INTRAMUSCULAR | Status: AC
  Filled 2020-06-10: qty 2

## 2020-06-10 MED ORDER — OXYCODONE HCL 5 MG PO TABS: ORAL_TABLET | ORAL | 0 refills | Status: AC

## 2020-06-10 MED ORDER — LIDOCAINE HCL (CARDIAC) PF 100 MG/5ML IV SOSY
PREFILLED_SYRINGE | INTRAVENOUS | Status: DC | PRN
  Administered 2020-06-10: 20 mg via INTRAVENOUS

## 2020-06-10 MED ORDER — FENTANYL CITRATE (PF) 100 MCG/2ML IJ SOLN: 25.0000 ug | INTRAMUSCULAR | Status: DC | PRN

## 2020-06-10 MED ORDER — PROPOFOL 10 MG/ML IV BOLUS
INTRAVENOUS | Status: AC
  Filled 2020-06-10: qty 20

## 2020-06-10 MED ORDER — ROCURONIUM BROMIDE 100 MG/10ML IV SOLN
INTRAVENOUS | Status: DC | PRN
  Administered 2020-06-10: 40 mg via INTRAVENOUS

## 2020-06-10 MED ORDER — MIDAZOLAM HCL 2 MG/2ML IJ SOLN
2.0000 mg | Freq: Once | INTRAMUSCULAR | Status: AC
  Administered 2020-06-10: 2 mg via INTRAVENOUS

## 2020-06-10 MED ORDER — VANCOMYCIN HCL 1 G IV SOLR
INTRAVENOUS | Status: DC | PRN
  Administered 2020-06-10: 1000 mg

## 2020-06-10 SURGICAL SUPPLY — 72 items
APL PRP STRL LF DISP 70% ISPRP (MISCELLANEOUS) ×1
BIT DRILL 3.2 (BIT) ×2
BIT DRILL 3.2XCALB NS DISP (BIT) ×1 IMPLANT
BIT DRILL CALIBRATED 2.7 (BIT) ×2 IMPLANT
BIT DRL 3.2XCALB NS DISP (BIT) ×1
BLADE CLIPPER SURG (BLADE) IMPLANT
BLADE SURG 10 STRL SS (BLADE) ×2 IMPLANT
BLADE SURG 15 STRL LF DISP TIS (BLADE) ×2 IMPLANT
BLADE SURG 15 STRL SS (BLADE) ×4
CHLORAPREP W/TINT 26 (MISCELLANEOUS) ×2 IMPLANT
CLSR STERI-STRIP ANTIMIC 1/2X4 (GAUZE/BANDAGES/DRESSINGS) ×4 IMPLANT
COOLER ICEMAN CLASSIC (MISCELLANEOUS) ×4 IMPLANT
COVER WAND RF STERILE (DRAPES) IMPLANT
DECANTER SPIKE VIAL GLASS SM (MISCELLANEOUS) IMPLANT
DRAPE C-ARM 42X72 X-RAY (DRAPES) ×2 IMPLANT
DRAPE IMP U-DRAPE 54X76 (DRAPES) ×2 IMPLANT
DRAPE INCISE IOBAN 66X45 STRL (DRAPES) IMPLANT
DRAPE U-SHAPE 76X120 STRL (DRAPES) ×4 IMPLANT
DRSG AQUACEL AG ADV 3.5X 6 (GAUZE/BANDAGES/DRESSINGS) IMPLANT
DRSG AQUACEL AG ADV 3.5X10 (GAUZE/BANDAGES/DRESSINGS) ×2 IMPLANT
ELECT REM PT RETURN 9FT ADLT (ELECTROSURGICAL) ×2
ELECTRODE REM PT RTRN 9FT ADLT (ELECTROSURGICAL) ×1 IMPLANT
GLOVE ECLIPSE 6.5 STRL STRAW (GLOVE) ×4 IMPLANT
GLOVE ECLIPSE 8.0 STRL XLNG CF (GLOVE) ×2 IMPLANT
GLOVE SRG 8 PF TXTR STRL LF DI (GLOVE) ×1 IMPLANT
GLOVE SURG ENC MOIS LTX SZ6.5 (GLOVE) IMPLANT
GLOVE SURG UNDER POLY LF SZ6.5 (GLOVE) ×2 IMPLANT
GLOVE SURG UNDER POLY LF SZ7 (GLOVE) ×4 IMPLANT
GLOVE SURG UNDER POLY LF SZ8 (GLOVE) ×2
GOWN STRL REUS W/ TWL LRG LVL3 (GOWN DISPOSABLE) ×2 IMPLANT
GOWN STRL REUS W/TWL LRG LVL3 (GOWN DISPOSABLE) ×4
GOWN STRL REUS W/TWL XL LVL3 (GOWN DISPOSABLE) ×2 IMPLANT
K-WIRE 2X5 SS THRDED S3 (WIRE) ×4
KIT LEG STABILIZATION (KITS) ×2 IMPLANT
KIT STABILIZATION SHOULDER (MISCELLANEOUS) IMPLANT
KWIRE 2X5 SS THRDED S3 (WIRE) ×2 IMPLANT
NS IRRIG 1000ML POUR BTL (IV SOLUTION) ×2 IMPLANT
PACK ARTHROSCOPY DSU (CUSTOM PROCEDURE TRAY) ×2 IMPLANT
PACK BASIN DAY SURGERY FS (CUSTOM PROCEDURE TRAY) ×2 IMPLANT
PAD COLD SHLDR WRAP-ON (PAD) ×4 IMPLANT
PAD ORTHO SHOULDER 7X19 LRG (SOFTGOODS) ×2 IMPLANT
PEG LOCKING 3.2MMX54 (Peg) ×2 IMPLANT
PEG LOCKING 3.2X34 (Screw) ×2 IMPLANT
PEG LOCKING 3.2X36 (Screw) ×2 IMPLANT
PEG LOCKING 3.2X38 (Screw) ×2 IMPLANT
PEG LOCKING 3.2X42 (Screw) ×2 IMPLANT
PEG LOCKING 3.2X52 (Peg) ×2 IMPLANT
PENCIL SMOKE EVACUATOR (MISCELLANEOUS) ×2 IMPLANT
PLATE PROX HUMERUS 4H LEFT LOW (Plate) ×2 IMPLANT
SCREW LOCK CORT STAR 3.5X24 (Screw) ×2 IMPLANT
SCREW LOW PROF TIS 3.5X28MM (Screw) ×2 IMPLANT
SCREW LP NL T15 3.5X24 (Screw) ×2 IMPLANT
SCREW PEG LOCK 3.2X30MM (Screw) ×2 IMPLANT
SHEET MEDIUM DRAPE 40X70 STRL (DRAPES) ×2 IMPLANT
SLEEVE MEASURING 3.2 (BIT) ×2 IMPLANT
SLEEVE SCD COMPRESS KNEE MED (MISCELLANEOUS) ×2 IMPLANT
SLING ARM FOAM STRAP LRG (SOFTGOODS) ×2 IMPLANT
SPONGE LAP 18X18 RF (DISPOSABLE) ×2 IMPLANT
SUT ETHIBOND NAB CT1 #1 30IN (SUTURE) ×2 IMPLANT
SUT FIBERWIRE #2 38 T-5 BLUE (SUTURE)
SUT MAXBRAID #2 CVD NDL (SUTURE) ×2 IMPLANT
SUT MNCRL AB 4-0 PS2 18 (SUTURE) ×2 IMPLANT
SUT VIC AB 0 CT1 27 (SUTURE) ×2
SUT VIC AB 0 CT1 27XBRD ANBCTR (SUTURE) ×1 IMPLANT
SUT VIC AB 2-0 SH 27 (SUTURE)
SUT VIC AB 2-0 SH 27XBRD (SUTURE) IMPLANT
SUT VIC AB 3-0 SH 27 (SUTURE) ×2
SUT VIC AB 3-0 SH 27X BRD (SUTURE) ×1 IMPLANT
SUTURE FIBERWR #2 38 T-5 BLUE (SUTURE) IMPLANT
SYR BULB IRRIG 60ML STRL (SYRINGE) ×2 IMPLANT
TOWEL GREEN STERILE FF (TOWEL DISPOSABLE) ×4 IMPLANT
TUBE SUCTION HIGH CAP CLEAR NV (SUCTIONS) ×2 IMPLANT

## 2020-06-10 NOTE — Anesthesia Preprocedure Evaluation (Signed)
Anesthesia Evaluation  Patient identified by MRN, date of birth, ID band Patient awake    Reviewed: Allergy & Precautions, NPO status , Patient's Chart, lab work & pertinent test results  Airway Mallampati: II  TM Distance: >3 FB Neck ROM: Full    Dental  (+) Dental Advisory Given   Pulmonary Current Smoker,    breath sounds clear to auscultation       Cardiovascular negative cardio ROS   Rhythm:Regular Rate:Normal     Neuro/Psych negative neurological ROS     GI/Hepatic negative GI ROS, Neg liver ROS,   Endo/Other  negative endocrine ROS  Renal/GU negative Renal ROS     Musculoskeletal  (+) Arthritis ,   Abdominal   Peds  Hematology negative hematology ROS (+)   Anesthesia Other Findings   Reproductive/Obstetrics                             Anesthesia Physical Anesthesia Plan  ASA: II  Anesthesia Plan: General   Post-op Pain Management:  Regional for Post-op pain   Induction: Intravenous  PONV Risk Score and Plan: 2 and Dexamethasone, Ondansetron and Treatment may vary due to age or medical condition  Airway Management Planned: Oral ETT  Additional Equipment: None  Intra-op Plan:   Post-operative Plan: Extubation in OR  Informed Consent: I have reviewed the patients History and Physical, chart, labs and discussed the procedure including the risks, benefits and alternatives for the proposed anesthesia with the patient or authorized representative who has indicated his/her understanding and acceptance.     Dental advisory given  Plan Discussed with: CRNA  Anesthesia Plan Comments:         Anesthesia Quick Evaluation

## 2020-06-10 NOTE — Anesthesia Procedure Notes (Signed)
Anesthesia Regional Block: Interscalene brachial plexus block   Pre-Anesthetic Checklist: ,, timeout performed, Correct Patient, Correct Site, Correct Laterality, Correct Procedure, Correct Position, site marked, Risks and benefits discussed,  Surgical consent,  Pre-op evaluation,  At surgeon's request and post-op pain management  Laterality: Left  Prep: chloraprep       Needles:  Injection technique: Single-shot  Needle Type: Echogenic Needle     Needle Length: 9cm  Needle Gauge: 21     Additional Needles:   Procedures:, nerve stimulator,,, ultrasound used (permanent image in chart),,,,   Nerve Stimulator or Paresthesia:  Response: deltoid and bicep, 0.5 mA,   Additional Responses:   Narrative:  Start time: 06/10/2020 11:24 AM End time: 06/10/2020 11:30 AM Injection made incrementally with aspirations every 5 mL.  Performed by: Personally  Anesthesiologist: Marcene Duos, MD

## 2020-06-10 NOTE — Anesthesia Procedure Notes (Signed)
Procedure Name: Intubation Date/Time: 06/10/2020 12:26 PM Performed by: Maryella Shivers, CRNA Pre-anesthesia Checklist: Patient identified, Emergency Drugs available, Suction available and Patient being monitored Patient Re-evaluated:Patient Re-evaluated prior to induction Oxygen Delivery Method: Circle system utilized Preoxygenation: Pre-oxygenation with 100% oxygen Induction Type: IV induction Ventilation: Mask ventilation without difficulty Laryngoscope Size: Mac and 3 Grade View: Grade I Tube type: Oral Tube size: 7.0 mm Number of attempts: 1 Airway Equipment and Method: Stylet and Oral airway Placement Confirmation: ETT inserted through vocal cords under direct vision,  positive ETCO2 and breath sounds checked- equal and bilateral Secured at: 20 cm Tube secured with: Tape Dental Injury: Teeth and Oropharynx as per pre-operative assessment

## 2020-06-10 NOTE — Discharge Instructions (Signed)
Post Anesthesia Home Care Instructions  Activity: Get plenty of rest for the remainder of the day. A responsible individual must stay with you for 24 hours following the procedure.  For the next 24 hours, DO NOT: -Drive a car -Paediatric nurse -Drink alcoholic beverages -Take any medication unless instructed by your physician -Make any legal decisions or sign important papers.  Meals: Start with liquid foods such as gelatin or soup. Progress to regular foods as tolerated. Avoid greasy, spicy, heavy foods. If nausea and/or vomiting occur, drink only clear liquids until the nausea and/or vomiting subsides. Call your physician if vomiting continues.  Special Instructions/Symptoms: Your throat may feel dry or sore from the anesthesia or the breathing tube placed in your throat during surgery. If this causes discomfort, gargle with warm salt water. The discomfort should disappear within 24 hours.  If you had a scopolamine patch placed behind your ear for the management of post- operative nausea and/or vomiting:  1. The medication in the patch is effective for 72 hours, after which it should be removed.  Wrap patch in a tissue and discard in the trash. Wash hands thoroughly with soap and water. 2. You may remove the patch earlier than 72 hours if you experience unpleasant side effects which may include dry mouth, dizziness or visual disturbances. 3. Avoid touching the patch. Wash your hands with soap and water after contact with the patch.       Regional Anesthesia Blocks  1. Numbness or the inability to move the "blocked" extremity may last from 3-48 hours after placement. The length of time depends on the medication injected and your individual response to the medication. If the numbness is not going away after 48 hours, call your surgeon.  2. The extremity that is blocked will need to be protected until the numbness is gone and the  Strength has returned. Because you cannot feel it, you  will need to take extra care to avoid injury. Because it may be weak, you may have difficulty moving it or using it. You may not know what position it is in without looking at it while the block is in effect.  3. For blocks in the legs and feet, returning to weight bearing and walking needs to be done carefully. You will need to wait until the numbness is entirely gone and the strength has returned. You should be able to move your leg and foot normally before you try and bear weight or walk. You will need someone to be with you when you first try to ensure you do not fall and possibly risk injury.  4. Bruising and tenderness at the needle site are common side effects and will resolve in a few days.  5. Persistent numbness or new problems with movement should be communicated to the surgeon or the [facility] [phone] [place] [phone]).   Information for Discharge Teaching: EXPAREL (bupivacaine liposome injectable suspension)   Your surgeon or anesthesiologist gave you EXPAREL(bupivacaine) to help control your pain after surgery.   EXPAREL is a local anesthetic that provides pain relief by numbing the tissue around the surgical site.  EXPAREL is designed to release pain medication over time and can control pain for up to 72 hours.  Depending on how you respond to EXPAREL, you may require less pain medication during your recovery.  Possible side effects:  Temporary loss of sensation or ability to move in the area where bupivacaine was injected.  Nausea, vomiting, constipation  Rarely, numbness and tingling in your mouth or lips, lightheadedness, or anxiety may occur.  Call your doctor right away if you think you may be experiencing any of these sensations, or if you have other questions regarding possible side effects.  Follow all other discharge instructions given to you by your surgeon or nurse. Eat a healthy diet and drink plenty of water or  other fluids.  If you return to the hospital for any reason within 96 hours following the administration of EXPAREL, it is important for health care providers to know that you have received this anesthetic. A teal colored band has been placed on your arm with the date, time and amount of EXPAREL you have received in order to alert and inform your health care providers. Please leave this armband in place for the full 96 hours following administration, and then you may remove the band.   Ramond Marrow MD, MPH Alfonse Alpers, PA-C [facility] [address] [phone] (tel)   [phone] (fax)   POST-OPERATIVE INSTRUCTIONS - SHOULDER    WOUND CARE ? You may leave the operative dressing in place until your follow-up appointment. ? KEEP THE INCISIONS CLEAN AND DRY. ? There may be a small amount of fluid/bleeding leaking at the surgical site. This is normal after surgery.  ? If it fills with liquid or blood please call us immediately to change it for you. ? Use the provided ice machine or Ice packs as often as possible for the first 3-4 days, then as needed for pain relief.  Keep a layer of cloth or a shirt between your skin and the cooling unit to prevent frost bite as it can get very cold.  SHOWERING: - You may shower on Post-Op Day #2.  - The dressing is water resistant but do not scrub it as it may start to peel up.   - You may remove the sling for showering - Gently pat the area dry.  - Do not soak the shoulder in water. Do not go swimming in the pool or ocean until your sutures are removed. - KEEP THE INCISIONS CLEAN AND DRY.  EXERCISES ? Wear the sling at all times  ? You may remove the sling for showering, but keep the arm across the chest or in a secondary sling.   ? Do not lift anything heavier than 1 pound with your operative arm    REGIONAL ANESTHESIA (NERVE BLOCKS)  The anesthesia team may have performed a nerve block for you if safe in  the setting of your care.  This is a great tool used to minimize pain.  Typically the block may start wearing off overnight but the long acting medicine may last for 3-4 days.  The nerve block wearing off can be a challenging period but please utilize your as needed pain medications to try and manage this period.    POST-OP MEDICATIONS- Multimodal approach to pain control  In general your pain will be controlled with a combination of substances.  Prescriptions unless otherwise discussed are electronically sent to your pharmacy.  This is a carefully made plan we use to minimize narcotic use.     ? Diclofenac - Anti-inflammatory medication taken on a scheduled basis ? Acetaminophen - Non-narcotic pain medicine taken on a scheduled basis  ? Oxycodone - This is a strong narcotic, to be used only on an as needed basis for pain. ? Zofran -  take as needed for nausea   Diclofenac -  these are anti-inflammatory and pain relievers.  Do not take additional ibuprofen, naproxen or other NSAID while taking this medicine.   FOLLOW-UP ? If you develop a Fever (>101.5), Redness or Drainage from the surgical incision site, please call our office to arrange for an evaluation. ? Please call the office to schedule a follow-up appointment for a wound check, 7-10 days post-operatively.  IF YOU HAVE ANY QUESTIONS, PLEASE FEEL FREE TO CALL OUR OFFICE.  HELPFUL INFORMATION   If you had a block, it will wear off between 8-24 hrs postop typically.  This is period when your pain may go from nearly zero to the pain you would have had post-op without the block.  This is an abrupt transition but nothing dangerous is happening.  You may take an extra dose of narcotic when this happens.  ? Your arm will be in a sling following surgery. I will let you know the exact duration at your follow-up visit.  ? You may be more comfortable sleeping in a semi-seated position the first few nights following surgery.  Keep a pillow  propped under the elbow and forearm for comfort.  If you have a recliner type of chair it might be beneficial.  If not that is fine too, but it would be helpful to sleep propped up with pillows behind your operated shoulder as well under your elbow and forearm.  This will reduce pulling on the suture lines.  ? When dressing, put your operative arm in the sleeve first.  When getting undressed, take your operative arm out last.  Loose fitting, button-down shirts are recommended.  ? In most states it is against the law to drive while your arm is in a sling. And certainly against the law to drive while taking narcotics.  ? You may return to work/school in the next couple of days when you feel up to it. Desk work and typing in the sling is fine.  ? We suggest you use the pain medication the first night prior to going to bed, in order to ease any pain when the anesthesia wears off. You should avoid taking pain medications on an empty stomach as it will make you nauseous.  ? Do not drink alcoholic beverages or take illicit drugs when taking pain medications.  ? Pain medication may make you constipated.  Below are a few solutions to try in this order: - Decrease the amount of pain medication if you arent having pain. - Drink lots of decaffeinated fluids. - Drink prune juice and/or each dried prunes  o If the first 3 dont work start with additional solutions - Take Colace - an over-the-counter stool softener - Take Senokot - an over-the-counter laxative - Take Miralax - a stronger over-the-counter laxative    For more information including helpful videos and documents visit our website:   [email or web address]

## 2020-06-10 NOTE — Transfer of Care (Signed)
Immediate Anesthesia Transfer of Care Note  Patient: Sara [name]. Oleksy  Procedure(s) Performed: OPEN REDUCTION INTERNAL FIXATION (ORIF) PROXIMAL HUMERUS FRACTURE (Left Shoulder)  Patient Location: PACU  Anesthesia Type:GA combined with regional for post-op pain  Level of Consciousness: sedated  Airway & Oxygen Therapy: Patient Spontanous Breathing and Patient connected to face mask oxygen  Post-op Assessment: Report given to RN and Post -op Vital signs reviewed and stable  Post vital signs: Reviewed and stable  Last Vitals:  Vitals Value Taken Time  BP 107/70 06/10/20 1353  Temp    Pulse 90 06/10/20 1354  Resp 13 06/10/20 1354  SpO2 100 % 06/10/20 1354  Vitals shown include unvalidated device data.  Last Pain:  Vitals:   06/10/20 1012  TempSrc: Oral  PainSc: 10-Worst pain ever         Complications: No complications documented.

## 2020-06-10 NOTE — Anesthesia Postprocedure Evaluation (Signed)
Anesthesia Post Note  Patient: Sara [name]. Zettler  Procedure(s) Performed: OPEN REDUCTION INTERNAL FIXATION (ORIF) PROXIMAL HUMERUS FRACTURE (Left Shoulder)     Patient location during evaluation: PACU Anesthesia Type: General Level of consciousness: awake and alert Pain management: pain level controlled Vital Signs Assessment: post-procedure vital signs reviewed and stable Respiratory status: spontaneous breathing, nonlabored ventilation, respiratory function stable and patient connected to nasal cannula oxygen Cardiovascular status: blood pressure returned to baseline and stable Postop Assessment: no apparent nausea or vomiting Anesthetic complications: no   No complications documented.  Last Vitals:  Vitals:   06/10/20 1402 06/10/20 1415  BP:  114/87  Pulse: (!) 102 99  Resp: 19 20  Temp:    SpO2: 98% 95%    Last Pain:  Vitals:   06/10/20 1415  TempSrc:   PainSc: 0-No pain                 Kennieth Rad

## 2020-06-10 NOTE — Interval H&P Note (Signed)
History and Physical Interval Note:  06/10/2020 12:01 PM  Sara [name]  has presented today for surgery, with the diagnosis of left proximal humerus fracture.  The various methods of treatment have been discussed with the patient and family. After consideration of risks, benefits and other options for treatment, the patient has consented to  Procedure(s): OPEN REDUCTION INTERNAL FIXATION (ORIF) PROXIMAL HUMERUS FRACTURE (Left) as a surgical intervention.  The patient's history has been reviewed, patient examined, no change in status, stable for surgery.  I have reviewed the patient's chart and labs.  Questions were answered to the patient's satisfaction.     Bjorn Pippin

## 2020-06-10 NOTE — Progress Notes (Signed)
Assisted Dr. Rob Fitzgerald with left, ultrasound guided, interscalene  block. Side rails up, monitors on throughout procedure. See vital signs in flow sheet. Tolerated Procedure well. 

## 2020-06-10 NOTE — Op Note (Signed)
Orthopaedic Surgery Operative Note (CSN: [id number])  Sara [name]. Sara [name]  [birth year] Date of Surgery: 06/10/2020   Diagnoses:  Left three-part proximal humerus fracture  Procedure: Left three-part proximal humerus fracture open reduction internal fixation Left proximal humeral shaft fracture open reduction internal fixation   Operative Finding Successful completion of the planned procedure. Patient's bone quality was satisfactory. We had great fixation overall and anatomic reduction. Were quite happy with our fixation but it was clear that there was a surgical neck fracture as well as her greater tuberosity fracture as well. We are able to fixate the greater tuberosity with a combination of the plate as well as 2 sutures at the bone tendon junction of the cuff.  Post-operative plan: The patient will be nonweightbearing in the sling but okay to use her arm for ADLs and hand to mouth activities with therapy to start after first visit.  The patient will be discharged home.  DVT prophylaxis Aspirin 81 mg twice daily for 6 weeks.   Pain control with PRN pain medication preferring oral medicines.  Follow up plan will be scheduled in approximately 7 days for incision check and XR.  Post-Op Diagnosis: Same Surgeons:Primary: Bjorn Pippin, MD Assistants:Caroline McBane PA-C Location: [facility] OR ROOM 6 Anesthesia: General with regional anesthesia Antibiotics: Ancef 2 g with local vancomycin powder 1 g at the surgical site Tourniquet time: * No tourniquets in log * Estimated Blood Loss: Minimal Complications: None Specimens: None Implants: Implant Name Type Inv. Item Serial No. Manufacturer Lot No. LRB No. Used Action  PLATE PROX HUMERUS 4H LEFT LOW - [id number] Plate PLATE PROX HUMERUS 4H LEFT LOW  [employer]IM[employer]ON(ORTH,TRAU,BIO,SG) STERILIZED ON SET Left 1 Implanted  PEG LOCKING 3.2X38 - [id number] Screw PEG LOCKING 3.2X38  [employer](ORTH,TRAU,BIO,SG) STERILIZED ON SET Left 1 Implanted  PEG LOCKING 3.2X36  - [id number] Screw PEG LOCKING 3.2X36  ZIMMER [employer]ON(ORTH,TRAU,BIO,SG) STERILIZED ON SET Left 1 Implanted  PEG LOCKING 3.2X42 - [id number] Screw PEG LOCKING 3.2X42  ZIMMER [employer]ON(ORTH,TRAU,BIO,SG) STERILIZED ON SET Left 1 Implanted  PEG LOCKING 3.2MMX54 - [id number] Peg PEG LOCKING 3.2MMX54  ZIMMER [employer]ON(ORTH,TRAU,BIO,SG) STERILIZED ON SET Left 1 Implanted  PEG LOCKING 3.2X52 - [id number] Peg PEG LOCKING 3.2X52  ZIMMER [employer]ON(ORTH,TRAU,BIO,SG) STERILIZED ON SET Left 1 Implanted  SCREW PEG LOCK 3.2X30MM - [id number] Screw SCREW PEG LOCK 3.2X30MM  ZIMMER [employer]ON(ORTH,TRAU,BIO,SG) STERILIZED ON SET Left 1 Implanted  PEG LOCKING 3.2X34 - [id number] Screw PEG LOCKING 3.2X34  ZIMMER [employer]ON(ORTH,TRAU,BIO,SG) STERILIZED ON SET Left 1 Implanted  SCREW LP NL T15 3.5X24 - [id number] Screw SCREW LP NL T15 3.5X24  ZIMMER [employer]ON(ORTH,TRAU,BIO,SG) STERILIZED ON SET Left 1 Implanted  SCREW LOW PROF TIS 3.5X28MM - [id number] Screw SCREW LOW PROF TIS 3.5X28MM  ZIMMER [employer]ON(ORTH,TRAU,BIO,SG) STERILIZED ON SET Left 1 Implanted  SCREW LOCK CORT STAR 3.5X24 - [id number] Screw SCREW LOCK CORT STAR 3.5X24  ZIMMER RECON(ORTH,TRAU,BIO,SG) STERILIZED ON SET Left 1 Implanted    Indications for Surgery:   Sara [name]. Sara [name] is a 60 y.o. female with fall resulting in a proximal humerus fracture with shaft extension. She had involvement of the proximal humerus as well the proximal humeral shaft and due to the translation on her x-rays and her ability to potentially start rehab earlier she elected proceed with surgical management. Benefits and risks of operative and nonoperative management were discussed prior to surgery with patient/guardian(s) and informed consent form was completed.  Specific risks including infection, need for additional surgery, nonunion, malunion, nerve or vessel damage, loss of fixation and avascular necrosis amongst others.  Procedure:   The patient was identified properly. Informed consent was obtained and the surgical site  was marked. The patient was taken up to suite where general anesthesia was induced.  The patient was positioned beachchair on [employer] table.  The left humerus was prepped and draped in the usual sterile fashion.  Timeout was performed before the beginning of the case.  We used a deltopectoral interval approach with the skin sharply achieving hemostasis we progressed. We identified the deltopectoral interval and protected and retracted the cephalic vein laterally. At that point were able to identify the clavipectoral fascia which was opened. We placed a Covell retractor to help with our visualization.  This point we identified the fracture fragment in the shaft. We are able to manually reduce this and hold it with a series of clamps as well as a K wire provisionally. At that point we noted that the proximal humeral fragments were able to be managed with suture and 2 #2 max. Sutures were placed at the bone tendon junction of the greater tuberosity fragment. Once were able to place the sutures we selected a Biomet Alps plate and used fluoroscopic guidance to place the plate in the appropriate location. We are able to get good spans of the fracture with a short plate.  We then used fluoroscopic guidance to place our distal screw in the slotted hole pulling the plate to bone before filling her proximal holes with pegs under fluoroscopic guidance to ensure that we did not perforate the articular surface. We then placed another locking and nonlocking screw distally. We had great fixation overall and robust purchase of the bone. We passed our sutures through the plate and tied these for reinforcement. Final fluoroscopic images including around the world views demonstrated no perforation of the joint surface and great fixation with anatomic reduction.  We irrigated the wound copiously before placing local antibiotic as listed above.  We closed the incision in a multilayer fashion with absorbable suture.  Sterile  dressing was placed. Sling was placed. Patient was awoken taken to PACU in stable condition.  Sara Alpers, PA-C, present and scrubbed throughout the case, critical for completion in a timely fashion, and for retraction, instrumentation, closure.

## 2020-06-11 ENCOUNTER — Encounter (HOSPITAL_BASED_OUTPATIENT_CLINIC_OR_DEPARTMENT_OTHER): Payer: Self-pay | Admitting: Orthopaedic Surgery

## 2020-06-18 DIAGNOSIS — S42295D Other nondisplaced fracture of upper end of left humerus, subsequent encounter for fracture with routine healing: Secondary | ICD-10-CM | POA: Diagnosis not present

## 2020-06-23 ENCOUNTER — Ambulatory Visit: Payer: PRIVATE HEALTH INSURANCE | Admitting: Nurse Practitioner

## 2020-06-24 ENCOUNTER — Encounter: Payer: Self-pay | Admitting: Family Medicine

## 2020-06-24 ENCOUNTER — Other Ambulatory Visit: Payer: Self-pay

## 2020-06-24 ENCOUNTER — Encounter: Payer: Self-pay | Admitting: Physical Therapy

## 2020-06-24 ENCOUNTER — Ambulatory Visit: Payer: BC Managed Care – PPO | Attending: Orthopaedic Surgery | Admitting: Physical Therapy

## 2020-06-24 ENCOUNTER — Ambulatory Visit: Payer: BC Managed Care – PPO | Admitting: Family Medicine

## 2020-06-24 VITALS — BP 105/65 | HR 80 | Temp 97.7°F | Ht 63.0 in | Wt 141.0 lb

## 2020-06-24 DIAGNOSIS — M6281 Muscle weakness (generalized): Secondary | ICD-10-CM | POA: Diagnosis not present

## 2020-06-24 DIAGNOSIS — R600 Localized edema: Secondary | ICD-10-CM

## 2020-06-24 DIAGNOSIS — M25612 Stiffness of left shoulder, not elsewhere classified: Secondary | ICD-10-CM | POA: Insufficient documentation

## 2020-06-24 DIAGNOSIS — M25512 Pain in left shoulder: Secondary | ICD-10-CM | POA: Insufficient documentation

## 2020-06-24 DIAGNOSIS — L308 Other specified dermatitis: Secondary | ICD-10-CM

## 2020-06-24 MED ORDER — MOMETASONE FUROATE 0.1 % EX CREA: 1.0000 "application " | TOPICAL_CREAM | Freq: Every day | CUTANEOUS | 0 refills | Status: DC

## 2020-06-24 NOTE — Therapy (Addendum)
[name] [address] [place], [place], [address] Phone: [phone]   Fax:  [phone]  Physical Therapy Evaluation  Patient Details  Name: Sara [name] MRN: [id number] Date of Birth: Jan 26, 1961 Referring Provider (PT): Ramond Marrow, MD   Encounter Date: 06/24/2020   PT End of Session - 06/24/20 1320    Visit Number 1    Number of Visits 16    Date for PT Re-Evaluation 08/26/20    Authorization Type BCBS; FOTO; Progress note every 10th visit    PT Start Time 1116    PT Stop Time 1200    PT Time Calculation (min) 44 min    Equipment Utilized During Treatment Other (comment)   left sling   Activity Tolerance Patient tolerated treatment well;Patient limited by pain    Behavior During Therapy [facility] for tasks assessed/performed           Past Medical History:  Diagnosis Date  . Arthritis   . Proximal humerus fracture 06/03/2020   left    Past Surgical History:  Procedure Laterality Date  . ABDOMINAL HYSTERECTOMY  1986  . calcaneus fracture Left   . FRACTURE SURGERY  1979   fx collar bone  . ORIF HUMERUS FRACTURE Left 06/10/2020   Procedure: OPEN REDUCTION INTERNAL FIXATION (ORIF) PROXIMAL HUMERUS FRACTURE;  Surgeon: Bjorn Pippin, MD;  Location: [facility];  Service: Orthopedics;  Laterality: Left;    There were no vitals filed for this visit.    Subjective Assessment - 06/24/20 1314    Subjective COVID-19 screening performed upon arrival. Patient arrives to physical therapy with reports of left shoulder and upper arm pain secondary to a left proximal humerus ORIF on 06/10/2020. Patient reported slipping on ice when she broke her arm. Patient is slowly becoming more independent with dressing activities but still needs assistance for doffing clothes. Patient reports independence with showering activities. Patient gains assistance for home activities as needed by her significant other/friend. Patient is consistent  with performing elbow and wrist ROM as instructed by MD. Patient reports pain at worst as 10/10 and pain at best is 0/10 with rest. Patient's goals are to decrease pain, improve movement, improve strength, and improve ability to perform home and work activities independently.    Pertinent History left proximal humerus ORIF 06/10/2020, sulfa drug allergy    Limitations Lifting;House hold activities    Diagnostic tests x-ray    Patient Stated Goals use arm normally again    Currently in Pain? Yes    Pain Score 8     Pain Location Arm    Pain Orientation Right    Pain Descriptors / Indicators Aching;Dull;Shooting    Pain Type Surgical pain    Pain Onset 1 to 4 weeks ago    Pain Frequency Intermittent    Aggravating Factors  "constantly hurts without ice on it"    Pain Relieving Factors "ice"    Effect of Pain on Daily Activities "I keep a very dull pain"              [facility] PT Assessment - 06/24/20 0001      Assessment   Medical Diagnosis Left 3 part proximal humerus fracture ORIF, proximal humeral shaft fracture    Referring Provider (PT) Ramond Marrow, MD    Onset Date/Surgical Date 06/10/20    Hand Dominance Right    Next MD Visit 3/142022    Prior Therapy not for this      Precautions  Precautions Shoulder    Type of Shoulder Precautions follow DR. Varkey's RTC protocol    Required Braces or Orthoses Sling      Restrictions   Weight Bearing Restrictions Yes    LUE Weight Bearing Non weight bearing      Balance Screen   Has the patient fallen in the past 6 months Yes    How many times? 1    Has the patient had a decrease in activity level because of a fear of falling?  No    Is the patient reluctant to leave their home because of a fear of falling?  No      Home Environment   Living Environment Private residence    Living Arrangements Spouse/significant other    Available Help at Discharge Friend(s)      Prior Function   Level of Independence Needs assistance with  ADLs      Observation/Other Assessments   Observations L shoulder sling donned, notable L shoulder guarding in sitting, brusing along lower arm    Skin Integrity incision with steri strips    Focus on Therapeutic Outcomes (FOTO)  96% limitation      Observation/Other Assessments-Edema    Edema --   notable edema throughout the shoulder and upper arm     Posture/Postural Control   Posture/Postural Control Postural limitations    Postural Limitations Rounded Shoulders;Forward head      ROM / Strength   AROM / PROM / Strength PROM      PROM   Overall PROM  Deficits;Due to pain    PROM Assessment Site Shoulder    Right/Left Shoulder Left    Left Shoulder Flexion 35 Degrees    Left Shoulder ABduction 43 Degrees    Left Shoulder External Rotation -4 Degrees      Palpation   Palpation comment tenderness to palpation to L shoulder posterior cuff musculature and biceps region               Vasopneumatic Device to left shoulder low pressure at 34 degrees for pain and edema [name], PT, DPT 06/30/20       Objective measurements completed on examination: See above findings.               PT Education - 06/24/20 1319    Education Details penulums, lateral, AP, and circles    Person(s) Educated Patient    Methods Explanation;Demonstration;Handout    Comprehension Verbalized understanding               PT Long Term Goals - 06/24/20 1329      PT LONG TERM GOAL #1   Title Patient will be independent with HEP    Time 8    Period Weeks    Status New      PT LONG TERM GOAL #2   Title Patient will demonstrate 4/5 or greater left shoulder MMT in all planes to improve strength and stability during functional tasks    Time 8    Period Weeks    Status New      PT LONG TERM GOAL #3   Title Patient will demonstrate 140+ degrees of left shoulder flexion AROM to improve ability to perform overhead tasks.    Time 8    Period Weeks    Status New       PT LONG TERM GOAL #4   Title Patient will demonstrate 65+ degrees of left shoulder ER AROM to improve donning/doffing apparel.  Time 8    Period Weeks    Status New      PT LONG TERM GOAL #5   Title Patient will report ability to perform home and work activities independently with left shoulder pain less than or equal to 3/10.    Time 8    Period Weeks    Status New                  Plan - 06/24/20 1321    Clinical Impression Statement Patient is a 60 year old right handed female who presents to physical therapy with left shoulder and upper arm pain and decreased R shoulder ROM secondary to fall and ORIF to proximal humerus on 06/10/2020. Patient observed with notable edema throughout shoulder and bruising to lower arm. Patient's incision is donned with steri-strips. Patient independent with donning and doffing left sling. Patient and PT discussed plan of care, following protocol per surgeon, and HEP to which patient reported understanding. Patient would benefit from skilled physical therapy to address deficits and address patient's goals.    Personal Factors and Comorbidities Comorbidity 2    Comorbidities left proximal humerus ORIF 06/10/2020, sulfa drug allergy    Examination-Activity Limitations Reach Overhead;Dressing    Examination-Participation Restrictions Occupation;Driving    Stability/Clinical Decision Making Stable/Uncomplicated    Clinical Decision Making Low    Rehab Potential Good    PT Frequency 2x / week    PT Duration 8 weeks    PT Treatment/Interventions ADLs/Self Care Home Management;Moist Heat;Electrical Stimulation;Cryotherapy;Functional mobility training;Therapeutic activities;Therapeutic exercise;Neuromuscular re-education;Manual techniques;Passive range of motion;Patient/family education;Scar mobilization;Vasopneumatic Device;Taping    PT Next Visit Plan Per protocol PROM to left shoulder, modalities PRN for pain relief    PT Home Exercise Plan see  patient education section    Consulted and Agree with Plan of Care Patient           Patient will benefit from skilled therapeutic intervention in order to improve the following deficits and impairments:  Decreased range of motion,Decreased activity tolerance,Impaired UE functional use,Pain,Postural dysfunction,Decreased strength  Visit Diagnosis: Acute pain of left shoulder - Plan: PT plan of care cert/re-cert  Stiffness of left shoulder, not elsewhere classified - Plan: PT plan of care cert/re-cert  Localized edema - Plan: PT plan of care cert/re-cert  Muscle weakness (generalized) - Plan: PT plan of care cert/re-cert     Problem List Patient Active Problem List   Diagnosis Date Noted  . Adjustment disorder with mixed anxiety and depressed mood 05/06/2017  . Tobacco use disorder 02/22/2017  . History of hysterectomy 02/22/2017  . Allergic rhinitis due to allergen 02/22/2017  . Chronic fatigue 02/22/2017  . S/P hysterectomy 09/13/2013    [name], PT, DPT 06/24/2020, 1:39 PM  [facility] [address] [place], [place], [address] Phone: [phone]   Fax:  [phone]  Name: Sara [name] MRN: [id number] Date of Birth: 04/01/1961

## 2020-06-24 NOTE — Progress Notes (Signed)
Chief Complaint  Patient presents with  . Rash    HPI  Patient presents today for several days of redness around the lips mostly at the left upper lip area.  Started out a couple of weeks ago as 2 tiny bumps above the left lip.  She popped those and the redness has spread.  It itches a little bit.  It does not burn.  No mouth lesions no fever chills sweats. PMH: Smoking status noted ROS: Per HPI  Objective: BP 105/65   Pulse 80   Temp 97.7 F (36.5 C) (Temporal)   Ht 5\' 3"  (1.6 m)   Wt 141 lb (64 kg)   BMI 24.98 kg/m  Gen: NAD, alert, cooperative with exam HEENT: NCAT, EOMI, PERRL Skin: There is mild diffuse erythema about the left lip.  Neuro: Alert and oriented, No gross deficits  Assessment and plan:  1. Other eczema     Meds ordered this encounter  Medications  . mometasone (ELOCON) 0.1 % cream    Sig: Apply 1 application topically daily. To affected areas    Dispense:  15 g    Refill:  0    Pt. Requests delivery please      Follow up as needed.  , MD

## 2020-06-29 ENCOUNTER — Ambulatory Visit: Payer: BC Managed Care – PPO | Admitting: Physical Therapy

## 2020-06-30 ENCOUNTER — Ambulatory Visit: Payer: BC Managed Care – PPO | Admitting: Physical Therapy

## 2020-06-30 ENCOUNTER — Other Ambulatory Visit: Payer: Self-pay

## 2020-06-30 DIAGNOSIS — M25512 Pain in left shoulder: Secondary | ICD-10-CM | POA: Diagnosis not present

## 2020-06-30 DIAGNOSIS — M6281 Muscle weakness (generalized): Secondary | ICD-10-CM

## 2020-06-30 DIAGNOSIS — R600 Localized edema: Secondary | ICD-10-CM | POA: Diagnosis not present

## 2020-06-30 DIAGNOSIS — M25612 Stiffness of left shoulder, not elsewhere classified: Secondary | ICD-10-CM | POA: Diagnosis not present

## 2020-06-30 NOTE — Therapy (Signed)
[name] [address] [place], [place], [address] Phone: [phone]   Fax:  [phone]  Physical Therapy Treatment  Patient Details  Name: Sara [name] MRN: [id number] Date of Birth: Feb 02, 1961 Referring Provider (PT): Ramond Marrow, MD   Encounter Date: 06/30/2020   PT End of Session - 06/30/20 1249    Visit Number 2    Number of Visits 16    Date for PT Re-Evaluation 08/26/20    Authorization Type BCBS; FOTO; Progress note every 10th visit    PT Start Time 1116    PT Stop Time 1200    PT Time Calculation (min) 44 min    Activity Tolerance Patient tolerated treatment well;Patient limited by pain    Behavior During Therapy [facility] for tasks assessed/performed           Past Medical History:  Diagnosis Date  . Arthritis   . Proximal humerus fracture 06/03/2020   left    Past Surgical History:  Procedure Laterality Date  . ABDOMINAL HYSTERECTOMY  1986  . calcaneus fracture Left   . FRACTURE SURGERY  1979   fx collar bone  . ORIF HUMERUS FRACTURE Left 06/10/2020   Procedure: OPEN REDUCTION INTERNAL FIXATION (ORIF) PROXIMAL HUMERUS FRACTURE;  Surgeon: Bjorn Pippin, MD;  Location: [facility];  Service: Orthopedics;  Laterality: Left;    There were no vitals filed for this visit.   Subjective Assessment - 06/30/20 1244    Subjective COVID-19 screening performed upon arrival. Patient arrives with no pain in left arm but with a muscle strain on the right side of her arm after doing too much over the weekend.    Pertinent History left proximal humerus ORIF 06/10/2020, sulfa drug allergy    Limitations Lifting;House hold activities    Diagnostic tests x-ray    Patient Stated Goals use arm normally again    Currently in Pain? No/denies              [facility] PT Assessment - 06/30/20 0001      Assessment   Medical Diagnosis Left 3 part proximal humerus fracture ORIF, proximal humeral shaft fracture    Referring  Provider (PT) Ramond Marrow, MD    Onset Date/Surgical Date 06/10/20    Hand Dominance Right    Next MD Visit 07/27/2020    Prior Therapy not for this      Precautions   Precautions Shoulder    Type of Shoulder Precautions follow DR. Varkey's RTC protocol    Required Braces or Orthoses Sling                         [facility] Adult PT Treatment/Exercise - 06/30/20 0001      Modalities   Modalities Vasopneumatic      Vasopneumatic   Number Minutes Vasopneumatic  10 minutes    Vasopnuematic Location  Shoulder    Vasopneumatic Pressure Low    Vasopneumatic Temperature  34      Manual Therapy   Manual Therapy Passive ROM    Passive ROM gentle PROM into ER abd and flexion with intermittent oscillations to decrease guarding and pain                       PT Long Term Goals - 06/24/20 1329      PT LONG TERM GOAL #1   Title Patient will be independent with HEP    Time 8  Period Weeks    Status New      PT LONG TERM GOAL #2   Title Patient will demonstrate 4/5 or greater left shoulder MMT in all planes to improve strength and stability during functional tasks    Time 8    Period Weeks    Status New      PT LONG TERM GOAL #3   Title Patient will demonstrate 140+ degrees of left shoulder flexion AROM to improve ability to perform overhead tasks.    Time 8    Period Weeks    Status New      PT LONG TERM GOAL #4   Title Patient will demonstrate 65+ degrees of left shoulder ER AROM to improve donning/doffing apparel.    Time 8    Period Weeks    Status New      PT LONG TERM GOAL #5   Title Patient will report ability to perform home and work activities independently with left shoulder pain less than or equal to 3/10.    Time 8    Period Weeks    Status New                 Plan - 06/30/20 1250    Clinical Impression Statement Patient arrived with no pain in left shoulder but with reports of a "muscle pull" on the right shoulder region.  Patient able to tolerate PROM with intermittent oscillations to decreae pain and prevent muscle guarding. Vasopneumatic device performed with normal response upon completion.    Personal Factors and Comorbidities Comorbidity 2    Comorbidities left proximal humerus ORIF 06/10/2020, sulfa drug allergy    Examination-Activity Limitations Reach Overhead;Dressing    Examination-Participation Restrictions Occupation;Driving    Stability/Clinical Decision Making Stable/Uncomplicated    Clinical Decision Making Low    Rehab Potential Good    PT Frequency 2x / week    PT Duration 8 weeks    PT Treatment/Interventions ADLs/Self Care Home Management;Moist Heat;Electrical Stimulation;Cryotherapy;Functional mobility training;Therapeutic activities;Therapeutic exercise;Neuromuscular re-education;Manual techniques;Passive range of motion;Patient/family education;Scar mobilization;Vasopneumatic Device;Taping    PT Next Visit Plan Per protocol PROM to left shoulder, modalities PRN for pain relief    PT Home Exercise Plan see patient education section    Consulted and Agree with Plan of Care Patient           Patient will benefit from skilled therapeutic intervention in order to improve the following deficits and impairments:  Decreased range of motion,Decreased activity tolerance,Impaired UE functional use,Pain,Postural dysfunction,Decreased strength  Visit Diagnosis: Acute pain of left shoulder  Stiffness of left shoulder, not elsewhere classified  Localized edema  Muscle weakness (generalized)     Problem List Patient Active Problem List   Diagnosis Date Noted  . Adjustment disorder with mixed anxiety and depressed mood 05/06/2017  . Tobacco use disorder 02/22/2017  . History of hysterectomy 02/22/2017  . Allergic rhinitis due to allergen 02/22/2017  . Chronic fatigue 02/22/2017  . S/P hysterectomy 09/13/2013    [name], PT, DPT 06/30/2020, 12:56 PM  [name] [address] [place], [place], [address] Phone: [phone]   Fax:  [phone]  Name: Sara [name] MRN: [id number] Date of Birth: 1961-01-20

## 2020-07-01 ENCOUNTER — Encounter: Payer: BC Managed Care – PPO | Admitting: Physical Therapy

## 2020-07-02 ENCOUNTER — Ambulatory Visit: Payer: BC Managed Care – PPO | Admitting: Physical Therapy

## 2020-07-02 ENCOUNTER — Other Ambulatory Visit: Payer: Self-pay

## 2020-07-02 DIAGNOSIS — R600 Localized edema: Secondary | ICD-10-CM

## 2020-07-02 DIAGNOSIS — M25612 Stiffness of left shoulder, not elsewhere classified: Secondary | ICD-10-CM

## 2020-07-02 DIAGNOSIS — M6281 Muscle weakness (generalized): Secondary | ICD-10-CM

## 2020-07-02 DIAGNOSIS — M25512 Pain in left shoulder: Secondary | ICD-10-CM | POA: Diagnosis not present

## 2020-07-02 NOTE — Therapy (Signed)
[name] [address] [place], [place], [address] Phone: [phone]   Fax:  [phone]  Physical Therapy Treatment  Patient Details  Name: Sara [name] MRN: [id number] Date of Birth: May 13, 1961 Referring Provider (PT): Ramond Marrow, MD   Encounter Date: 07/02/2020   PT End of Session - 07/02/20 1155    Visit Number 3    Number of Visits 16    Date for PT Re-Evaluation 08/26/20    Authorization Type BCBS; FOTO; Progress note every 10th visit    PT Start Time 1115    PT Stop Time 1201    PT Time Calculation (min) 46 min    Equipment Utilized During Treatment Other (comment)   R sling   Activity Tolerance Patient tolerated treatment well    Behavior During Therapy [facility] for tasks assessed/performed           Past Medical History:  Diagnosis Date  . Arthritis   . Proximal humerus fracture 06/03/2020   left    Past Surgical History:  Procedure Laterality Date  . ABDOMINAL HYSTERECTOMY  1986  . calcaneus fracture Left   . FRACTURE SURGERY  1979   fx collar bone  . ORIF HUMERUS FRACTURE Left 06/10/2020   Procedure: OPEN REDUCTION INTERNAL FIXATION (ORIF) PROXIMAL HUMERUS FRACTURE;  Surgeon: Bjorn Pippin, MD;  Location: [facility];  Service: Orthopedics;  Laterality: Left;    There were no vitals filed for this visit.   Subjective Assessment - 07/02/20 1151    Subjective COVID-19 screening performed upon arrival. Patient reports feeling very sore after last session and more soreness at the end of the day.    Pertinent History left proximal humerus ORIF 06/10/2020, sulfa drug allergy    Limitations Lifting;House hold activities    Diagnostic tests x-ray    Patient Stated Goals use arm normally again    Currently in Pain? Yes    Pain Score 8     Pain Location Arm    Pain Orientation Right    Pain Descriptors / Indicators Aching;Dull;Shooting    Pain Type Surgical pain    Pain Onset 1 to 4 weeks ago     Pain Frequency Intermittent              [facility] PT Assessment - 07/02/20 0001      Assessment   Medical Diagnosis Left 3 part proximal humerus fracture ORIF, proximal humeral shaft fracture    Referring Provider (PT) Ramond Marrow, MD    Onset Date/Surgical Date 06/10/20    Hand Dominance Right    Next MD Visit 07/24/2020    Prior Therapy not for this      Precautions   Precautions Shoulder    Type of Shoulder Precautions follow DR. Varkey's RTC protocol    Required Braces or Orthoses Sling                         [facility] Adult PT Treatment/Exercise - 07/02/20 0001      Modalities   Modalities Vasopneumatic      Vasopneumatic   Number Minutes Vasopneumatic  15 minutes    Vasopnuematic Location  Shoulder    Vasopneumatic Pressure Low    Vasopneumatic Temperature  34      Manual Therapy   Manual Therapy Passive ROM    Passive ROM gentle PROM into ER abd and flexion with intermittent oscillations to decrease guarding and pain  PT Long Term Goals - 06/24/20 1329      PT LONG TERM GOAL #1   Title Patient will be independent with HEP    Time 8    Period Weeks    Status New      PT LONG TERM GOAL #2   Title Patient will demonstrate 4/5 or greater left shoulder MMT in all planes to improve strength and stability during functional tasks    Time 8    Period Weeks    Status New      PT LONG TERM GOAL #3   Title Patient will demonstrate 140+ degrees of left shoulder flexion AROM to improve ability to perform overhead tasks.    Time 8    Period Weeks    Status New      PT LONG TERM GOAL #4   Title Patient will demonstrate 65+ degrees of left shoulder ER AROM to improve donning/doffing apparel.    Time 8    Period Weeks    Status New      PT LONG TERM GOAL #5   Title Patient will report ability to perform home and work activities independently with left shoulder pain less than or equal to 3/10.    Time 8    Period Weeks     Status New                 Plan - 07/02/20 1155    Clinical Impression Statement Patient arrived with more soreness in left shoulder today. Patient was able to tolerate PROM but with guarding particularly with L shoulder ER. Patient educated soreness is a normal response to therapy and ice as needed to reduce inflammation. Patient reported understanding. Normal response to modalities upon removal.    Personal Factors and Comorbidities Comorbidity 2    Comorbidities left proximal humerus ORIF 06/10/2020, sulfa drug allergy    Examination-Activity Limitations Reach Overhead;Dressing    Examination-Participation Restrictions Occupation;Driving    Stability/Clinical Decision Making Stable/Uncomplicated    Clinical Decision Making Low    Rehab Potential Good    PT Frequency 2x / week    PT Duration 8 weeks    PT Treatment/Interventions ADLs/Self Care Home Management;Moist Heat;Electrical Stimulation;Cryotherapy;Functional mobility training;Therapeutic activities;Therapeutic exercise;Neuromuscular re-education;Manual techniques;Passive range of motion;Patient/family education;Scar mobilization;Vasopneumatic Device;Taping    PT Next Visit Plan Per protocol PROM to left shoulder, modalities PRN for pain relief    PT Home Exercise Plan see patient education section    Consulted and Agree with Plan of Care Patient           Patient will benefit from skilled therapeutic intervention in order to improve the following deficits and impairments:  Decreased range of motion,Decreased activity tolerance,Impaired UE functional use,Pain,Postural dysfunction,Decreased strength  Visit Diagnosis: Acute pain of left shoulder  Stiffness of left shoulder, not elsewhere classified  Localized edema  Muscle weakness (generalized)     Problem List Patient Active Problem List   Diagnosis Date Noted  . Adjustment disorder with mixed anxiety and depressed mood 05/06/2017  . Tobacco use disorder  02/22/2017  . History of hysterectomy 02/22/2017  . Allergic rhinitis due to allergen 02/22/2017  . Chronic fatigue 02/22/2017  . S/P hysterectomy 09/13/2013    [name], PT, DPT 07/02/2020, 12:03 PM  [name] [address] [place], [place], [address] Phone: [phone]   Fax:  [phone]  Name: Sara [name] MRN: [id number] Date of Birth: 12-21-1960

## 2020-07-07 ENCOUNTER — Other Ambulatory Visit: Payer: Self-pay

## 2020-07-07 ENCOUNTER — Ambulatory Visit: Payer: BC Managed Care – PPO | Admitting: Physical Therapy

## 2020-07-07 ENCOUNTER — Encounter: Payer: Self-pay | Admitting: Physical Therapy

## 2020-07-07 DIAGNOSIS — R600 Localized edema: Secondary | ICD-10-CM

## 2020-07-07 DIAGNOSIS — M25512 Pain in left shoulder: Secondary | ICD-10-CM | POA: Diagnosis not present

## 2020-07-07 DIAGNOSIS — M25612 Stiffness of left shoulder, not elsewhere classified: Secondary | ICD-10-CM | POA: Diagnosis not present

## 2020-07-07 DIAGNOSIS — M6281 Muscle weakness (generalized): Secondary | ICD-10-CM

## 2020-07-07 NOTE — Therapy (Signed)
[name] [address] [place], [place], [address] Phone: [phone]   Fax:  [phone]  Physical Therapy Treatment  Patient Details  Name: Sara [name] MRN: [id number] Date of Birth: 1960/06/03 Referring Provider (PT): Ramond Marrow, MD   Encounter Date: 07/07/2020   PT End of Session - 07/07/20 1126    Visit Number 4    Number of Visits 16    Date for PT Re-Evaluation 08/26/20    Authorization Type BCBS; FOTO; Progress note every 10th visit    PT Start Time 1030    PT Stop Time 1115    PT Time Calculation (min) 45 min    Equipment Utilized During Treatment Other (comment)   left sling   Activity Tolerance Patient tolerated treatment well;Patient limited by pain    Behavior During Therapy [facility] for tasks assessed/performed           Past Medical History:  Diagnosis Date  . Arthritis   . Proximal humerus fracture 06/03/2020   left    Past Surgical History:  Procedure Laterality Date  . ABDOMINAL HYSTERECTOMY  1986  . calcaneus fracture Left   . FRACTURE SURGERY  1979   fx collar bone  . ORIF HUMERUS FRACTURE Left 06/10/2020   Procedure: OPEN REDUCTION INTERNAL FIXATION (ORIF) PROXIMAL HUMERUS FRACTURE;  Surgeon: Bjorn Pippin, MD;  Location: [facility];  Service: Orthopedics;  Laterality: Left;    There were no vitals filed for this visit.   Subjective Assessment - 07/07/20 1121    Subjective COVID-19 screening performed upon arrival. Patient reports more muscle tension in posterior L shoulder today.    Pertinent History left proximal humerus ORIF 06/10/2020, sulfa drug allergy    Limitations Lifting;House hold activities    Diagnostic tests x-ray    Patient Stated Goals use arm normally again    Currently in Pain? Yes   did not provide number on pain scale             [facility] PT Assessment - 07/07/20 0001      Assessment   Medical Diagnosis Left 3 part proximal humerus fracture ORIF, proximal  humeral shaft fracture    Referring Provider (PT) Ramond Marrow, MD    Onset Date/Surgical Date 06/10/20    Hand Dominance Right    Next MD Visit 07/24/2020    Prior Therapy not for this      Precautions   Precautions Shoulder    Type of Shoulder Precautions follow DR. Varkey's RTC protocol    Required Braces or Orthoses Sling                         [facility] Adult PT Treatment/Exercise - 07/07/20 0001      Modalities   Modalities Vasopneumatic      Vasopneumatic   Number Minutes Vasopneumatic  15 minutes    Vasopnuematic Location  Shoulder    Vasopneumatic Pressure Low    Vasopneumatic Temperature  34      Manual Therapy   Manual Therapy Passive ROM    Passive ROM gentle PROM into ER abd and flexion with intermittent oscillations to decrease guarding and pain                       PT Long Term Goals - 06/24/20 1329      PT LONG TERM GOAL #1   Title Patient will be independent with HEP  Time 8    Period Weeks    Status New      PT LONG TERM GOAL #2   Title Patient will demonstrate 4/5 or greater left shoulder MMT in all planes to improve strength and stability during functional tasks    Time 8    Period Weeks    Status New      PT LONG TERM GOAL #3   Title Patient will demonstrate 140+ degrees of left shoulder flexion AROM to improve ability to perform overhead tasks.    Time 8    Period Weeks    Status New      PT LONG TERM GOAL #4   Title Patient will demonstrate 65+ degrees of left shoulder ER AROM to improve donning/doffing apparel.    Time 8    Period Weeks    Status New      PT LONG TERM GOAL #5   Title Patient will report ability to perform home and work activities independently with left shoulder pain less than or equal to 3/10.    Time 8    Period Weeks    Status New                 Plan - 07/07/20 1257    Clinical Impression Statement Patient responded fairly well to therapy session though still with muscle  guarding with end range PROM. Gentle STW/M to posterior cuff provided to decrease tone and pain. Patient and PT discussed slightly more aggressive PROM to improve ROM and prevent frozen shoulder. Patient reported understanding. No adverse affects upon removal of modalities.    Personal Factors and Comorbidities Comorbidity 2    Comorbidities left proximal humerus ORIF 06/10/2020, sulfa drug allergy    Examination-Activity Limitations Reach Overhead;Dressing    Examination-Participation Restrictions Occupation;Driving    Stability/Clinical Decision Making Stable/Uncomplicated    Clinical Decision Making Low    Rehab Potential Good    PT Frequency 2x / week    PT Duration 8 weeks    PT Treatment/Interventions ADLs/Self Care Home Management;Moist Heat;Electrical Stimulation;Cryotherapy;Functional mobility training;Therapeutic activities;Therapeutic exercise;Neuromuscular re-education;Manual techniques;Passive range of motion;Patient/family education;Scar mobilization;Vasopneumatic Device;Taping    PT Next Visit Plan Per protocol PROM to left shoulder, modalities PRN for pain relief    PT Home Exercise Plan see patient education section    Consulted and Agree with Plan of Care Patient           Patient will benefit from skilled therapeutic intervention in order to improve the following deficits and impairments:  Decreased range of motion,Decreased activity tolerance,Impaired UE functional use,Pain,Postural dysfunction,Decreased strength  Visit Diagnosis: Acute pain of left shoulder  Stiffness of left shoulder, not elsewhere classified  Localized edema  Muscle weakness (generalized)     Problem List Patient Active Problem List   Diagnosis Date Noted  . Adjustment disorder with mixed anxiety and depressed mood 05/06/2017  . Tobacco use disorder 02/22/2017  . History of hysterectomy 02/22/2017  . Allergic rhinitis due to allergen 02/22/2017  . Chronic fatigue 02/22/2017  . S/P  hysterectomy 09/13/2013    [name], PT, DPT 07/07/2020, 1:01 PM  [facility] [address] [place], [place], [address] Phone: [phone]   Fax:  [phone]  Name: Sara [name] MRN: [id number] Date of Birth: 1960-07-20

## 2020-07-09 ENCOUNTER — Ambulatory Visit: Payer: BC Managed Care – PPO | Admitting: Physical Therapy

## 2020-07-09 ENCOUNTER — Other Ambulatory Visit: Payer: Self-pay

## 2020-07-09 DIAGNOSIS — M25612 Stiffness of left shoulder, not elsewhere classified: Secondary | ICD-10-CM | POA: Diagnosis not present

## 2020-07-09 DIAGNOSIS — R600 Localized edema: Secondary | ICD-10-CM

## 2020-07-09 DIAGNOSIS — M6281 Muscle weakness (generalized): Secondary | ICD-10-CM | POA: Diagnosis not present

## 2020-07-09 DIAGNOSIS — M25512 Pain in left shoulder: Secondary | ICD-10-CM | POA: Diagnosis not present

## 2020-07-09 NOTE — Therapy (Signed)
[name] [address] [place], [place], [address] Phone: [phone]   Fax:  [phone]  Physical Therapy Treatment  Patient Details  Name: Sara [name] MRN: [id number] Date of Birth: 1961-02-06 Referring Provider (PT): Ramond Marrow, MD   Encounter Date: 07/09/2020   PT End of Session - 07/09/20 1003    Visit Number 5    Number of Visits 16    Date for PT Re-Evaluation 08/26/20    Authorization Type BCBS; FOTO; Progress note every 10th visit    PT Start Time 0945    PT Stop Time 1030    PT Time Calculation (min) 45 min    Activity Tolerance Patient tolerated treatment well;Patient limited by pain    Behavior During Therapy [facility] for tasks assessed/performed           Past Medical History:  Diagnosis Date  . Arthritis   . Proximal humerus fracture 06/03/2020   left    Past Surgical History:  Procedure Laterality Date  . ABDOMINAL HYSTERECTOMY  1986  . calcaneus fracture Left   . FRACTURE SURGERY  1979   fx collar bone  . ORIF HUMERUS FRACTURE Left 06/10/2020   Procedure: OPEN REDUCTION INTERNAL FIXATION (ORIF) PROXIMAL HUMERUS FRACTURE;  Surgeon: Bjorn Pippin, MD;  Location: [facility];  Service: Orthopedics;  Laterality: Left;    There were no vitals filed for this visit.   Subjective Assessment - 07/09/20 1127    Subjective COVID-19 screening performed upon arrival. Patient arrives doing much better than last visit.    Pertinent History left proximal humerus ORIF 06/10/2020, sulfa drug allergy    Limitations Lifting;House hold activities    Diagnostic tests x-ray    Patient Stated Goals use arm normally again    Currently in Pain? Yes   "a little"             [facility] PT Assessment - 07/09/20 0001      Assessment   Medical Diagnosis Left 3 part proximal humerus fracture ORIF, proximal humeral shaft fracture    Referring Provider (PT) Ramond Marrow, MD    Onset Date/Surgical Date 06/10/20    Hand  Dominance Right    Next MD Visit 07/24/2020    Prior Therapy not for this      Precautions   Precautions Shoulder    Type of Shoulder Precautions follow DR. Varkey's RTC protocol    Required Braces or Orthoses Sling      Observation/Other Assessments   Focus on Therapeutic Outcomes (FOTO)  69% limitation      PROM   Left Shoulder Flexion 90 Degrees    Left Shoulder ABduction 65 Degrees    Left Shoulder External Rotation 31 Degrees                         [facility] Adult PT Treatment/Exercise - 07/09/20 0001      Modalities   Modalities Vasopneumatic      Vasopneumatic   Number Minutes Vasopneumatic  15 minutes    Vasopnuematic Location  Shoulder    Vasopneumatic Pressure Low    Vasopneumatic Temperature  34      Manual Therapy   Manual Therapy Passive ROM    Passive ROM gentle PROM into ER abd and flexion with intermittent oscillations to decrease guarding and pain                       PT  Long Term Goals - 07/09/20 1123      PT LONG TERM GOAL #1   Title Patient will be independent with HEP    Time 8    Period Weeks    Status On-going      PT LONG TERM GOAL #2   Title Patient will demonstrate 4/5 or greater left shoulder MMT in all planes to improve strength and stability during functional tasks    Time 8    Period Weeks    Status On-going      PT LONG TERM GOAL #3   Title Patient will demonstrate 140+ degrees of left shoulder flexion AROM to improve ability to perform overhead tasks.    Time 8    Period Weeks    Status On-going      PT LONG TERM GOAL #4   Title Patient will demonstrate 65+ degrees of left shoulder ER AROM to improve donning/doffing apparel.    Time 8    Period Weeks    Status On-going      PT LONG TERM GOAL #5   Title Patient will report ability to perform home and work activities independently with left shoulder pain less than or equal to 3/10.    Time 8    Period Weeks    Status On-going                  Plan - 07/09/20 1119    Clinical Impression Statement Patient responded well to therapy session though with pain at end ranges. Patient with improvements in PROM but still heavily guarded with PROM ER. Patient provided with frequent oscillations to decrease guarding. Patient and PT discussed protocol and by 6 weeks post op AAROM and isometrics can begin. Patient reported understanding. Normal response to modalities upon completion of vasopneumatic device.    Personal Factors and Comorbidities Comorbidity 2    Comorbidities left proximal humerus ORIF 06/10/2020, sulfa drug allergy    Examination-Activity Limitations Reach Overhead;Dressing    Examination-Participation Restrictions Occupation;Driving    Stability/Clinical Decision Making Stable/Uncomplicated    Clinical Decision Making Low    Rehab Potential Good    PT Frequency 2x / week    PT Duration 8 weeks    PT Treatment/Interventions ADLs/Self Care Home Management;Moist Heat;Electrical Stimulation;Cryotherapy;Functional mobility training;Therapeutic activities;Therapeutic exercise;Neuromuscular re-education;Manual techniques;Passive range of motion;Patient/family education;Scar mobilization;Vasopneumatic Device;Taping    PT Next Visit Plan Per protocol PROM to left shoulder for 6 weeks; modalities PRN for pain relief    PT Home Exercise Plan see patient education section    Consulted and Agree with Plan of Care Patient           Patient will benefit from skilled therapeutic intervention in order to improve the following deficits and impairments:  Decreased range of motion,Decreased activity tolerance,Impaired UE functional use,Pain,Postural dysfunction,Decreased strength  Visit Diagnosis: Acute pain of left shoulder  Stiffness of left shoulder, not elsewhere classified  Localized edema  Muscle weakness (generalized)     Problem List Patient Active Problem List   Diagnosis Date Noted  . Adjustment disorder  with mixed anxiety and depressed mood 05/06/2017  . Tobacco use disorder 02/22/2017  . History of hysterectomy 02/22/2017  . Allergic rhinitis due to allergen 02/22/2017  . Chronic fatigue 02/22/2017  . S/P hysterectomy 09/13/2013    [name], PT, DPT 07/09/2020, 11:28 AM  [facility] [address] [place], [place], [address] Phone: [phone]   Fax:  [phone]  Name: Sara [name] MRN:  [id number] Date of Birth: Mar 10, 1961

## 2020-07-13 ENCOUNTER — Other Ambulatory Visit: Payer: Self-pay

## 2020-07-13 ENCOUNTER — Ambulatory Visit: Payer: BC Managed Care – PPO | Admitting: Physical Therapy

## 2020-07-13 DIAGNOSIS — M25512 Pain in left shoulder: Secondary | ICD-10-CM

## 2020-07-13 DIAGNOSIS — M25612 Stiffness of left shoulder, not elsewhere classified: Secondary | ICD-10-CM

## 2020-07-13 DIAGNOSIS — R600 Localized edema: Secondary | ICD-10-CM

## 2020-07-13 DIAGNOSIS — M6281 Muscle weakness (generalized): Secondary | ICD-10-CM

## 2020-07-13 NOTE — Therapy (Signed)
[name] [address] [place], [place], [address] Phone: [phone]   Fax:  [phone]  Physical Therapy Treatment  Patient Details  Name: Sara [name] MRN: [id number] Date of Birth: May 06, 1961 Referring Provider (PT): Ramond Marrow, MD   Encounter Date: 07/13/2020   PT End of Session - 07/13/20 1600    Visit Number 6    Number of Visits 16    Date for PT Re-Evaluation 08/26/20    Authorization Type BCBS; FOTO; Progress note every 10th visit    PT Start Time 1115    PT Stop Time 1203    PT Time Calculation (min) 48 min    Equipment Utilized During Treatment Other (comment)   R sling   Activity Tolerance Patient tolerated treatment well;Patient limited by pain    Behavior During Therapy [facility] for tasks assessed/performed           Past Medical History:  Diagnosis Date  . Arthritis   . Proximal humerus fracture 06/03/2020   left    Past Surgical History:  Procedure Laterality Date  . ABDOMINAL HYSTERECTOMY  1986  . calcaneus fracture Left   . FRACTURE SURGERY  1979   fx collar bone  . ORIF HUMERUS FRACTURE Left 06/10/2020   Procedure: OPEN REDUCTION INTERNAL FIXATION (ORIF) PROXIMAL HUMERUS FRACTURE;  Surgeon: Bjorn Pippin, MD;  Location: [facility];  Service: Orthopedics;  Laterality: Left;    There were no vitals filed for this visit.   Subjective Assessment - 07/13/20 1559    Subjective COVID-19 screening performed upon arrival. Patient reports no new complaints, ongoing soreness.    Pertinent History left proximal humerus ORIF 06/10/2020, sulfa drug allergy    Limitations Lifting;House hold activities    Diagnostic tests x-ray    Patient Stated Goals use arm normally again    Currently in Pain? Yes    Pain Score 7     Pain Location Arm    Pain Orientation Right    Pain Descriptors / Indicators Aching;Dull;Sore    Pain Type Surgical pain    Pain Onset 1 to 4 weeks ago    Pain Frequency  Intermittent              [facility] PT Assessment - 07/13/20 0001      Assessment   Medical Diagnosis Left 3 part proximal humerus fracture ORIF, proximal humeral shaft fracture    Referring Provider (PT) Ramond Marrow, MD    Onset Date/Surgical Date 06/10/20    Hand Dominance Right    Next MD Visit 07/24/2020    Prior Therapy not for this      Precautions   Precautions Shoulder    Type of Shoulder Precautions follow DR. Varkey's RTC protocol    Required Braces or Orthoses Sling                         [facility] Adult PT Treatment/Exercise - 07/13/20 0001      Modalities   Modalities Vasopneumatic      Vasopneumatic   Number Minutes Vasopneumatic  15 minutes    Vasopnuematic Location  Shoulder    Vasopneumatic Pressure Low    Vasopneumatic Temperature  34      Manual Therapy   Manual Therapy Passive ROM    Manual therapy comments STW/M UT and L posterior cuff    Passive ROM gentle PROM into ER abd and flexion with frequent oscillations to decrease guarding and pain  PT Long Term Goals - 07/09/20 1123      PT LONG TERM GOAL #1   Title Patient will be independent with HEP    Time 8    Period Weeks    Status On-going      PT LONG TERM GOAL #2   Title Patient will demonstrate 4/5 or greater left shoulder MMT in all planes to improve strength and stability during functional tasks    Time 8    Period Weeks    Status On-going      PT LONG TERM GOAL #3   Title Patient will demonstrate 140+ degrees of left shoulder flexion AROM to improve ability to perform overhead tasks.    Time 8    Period Weeks    Status On-going      PT LONG TERM GOAL #4   Title Patient will demonstrate 65+ degrees of left shoulder ER AROM to improve donning/doffing apparel.    Time 8    Period Weeks    Status On-going      PT LONG TERM GOAL #5   Title Patient will report ability to perform home and work activities independently with left shoulder  pain less than or equal to 3/10.    Time 8    Period Weeks    Status On-going                 Plan - 07/13/20 1600    Clinical Impression Statement Patient was able tolerate treatment fairly well but still with pain with PROM. Patient required more oscillations to decrease muscle guarding, especially with ER PROM. Patient was able to tolerate more flexion PROM but still with pain. STW/M to left UT and posterior cuff musculature to decrease pain with good results. Normal response upon removal of modalities.    Personal Factors and Comorbidities Comorbidity 2    Comorbidities left proximal humerus ORIF 06/10/2020, sulfa drug allergy    Examination-Activity Limitations Reach Overhead;Dressing    Examination-Participation Restrictions Occupation;Driving    Stability/Clinical Decision Making Stable/Uncomplicated    Clinical Decision Making Low    PT Frequency 2x / week    PT Duration 8 weeks    PT Treatment/Interventions ADLs/Self Care Home Management;Moist Heat;Electrical Stimulation;Cryotherapy;Functional mobility training;Therapeutic activities;Therapeutic exercise;Neuromuscular re-education;Manual techniques;Passive range of motion;Patient/family education;Scar mobilization;Vasopneumatic Device;Taping    PT Next Visit Plan Per protocol PROM to left shoulder for 6 weeks; modalities PRN for pain relief    PT Home Exercise Plan see patient education section    Consulted and Agree with Plan of Care Patient           Patient will benefit from skilled therapeutic intervention in order to improve the following deficits and impairments:  Decreased range of motion,Decreased activity tolerance,Impaired UE functional use,Pain,Postural dysfunction,Decreased strength  Visit Diagnosis: Acute pain of left shoulder  Stiffness of left shoulder, not elsewhere classified  Localized edema  Muscle weakness (generalized)     Problem List Patient Active Problem List   Diagnosis Date Noted  .  Adjustment disorder with mixed anxiety and depressed mood 05/06/2017  . Tobacco use disorder 02/22/2017  . History of hysterectomy 02/22/2017  . Allergic rhinitis due to allergen 02/22/2017  . Chronic fatigue 02/22/2017  . S/P hysterectomy 09/13/2013    [name], PT, DPT 07/13/2020, 4:05 PM  [facility] [address] [place], [place], [address] Phone: [phone]   Fax:  [phone]  Name: Sara [name] MRN: [id number] Date of Birth: March 24, 1961

## 2020-07-14 ENCOUNTER — Ambulatory Visit: Payer: BC Managed Care – PPO | Admitting: Physical Therapy

## 2020-07-15 ENCOUNTER — Encounter: Payer: Self-pay | Admitting: Physical Therapy

## 2020-07-15 ENCOUNTER — Ambulatory Visit: Payer: BC Managed Care – PPO | Attending: Orthopaedic Surgery | Admitting: Physical Therapy

## 2020-07-15 ENCOUNTER — Other Ambulatory Visit: Payer: Self-pay

## 2020-07-15 DIAGNOSIS — M6281 Muscle weakness (generalized): Secondary | ICD-10-CM | POA: Diagnosis not present

## 2020-07-15 DIAGNOSIS — M25512 Pain in left shoulder: Secondary | ICD-10-CM | POA: Insufficient documentation

## 2020-07-15 DIAGNOSIS — R600 Localized edema: Secondary | ICD-10-CM | POA: Diagnosis not present

## 2020-07-15 DIAGNOSIS — M25612 Stiffness of left shoulder, not elsewhere classified: Secondary | ICD-10-CM | POA: Diagnosis not present

## 2020-07-15 NOTE — Therapy (Signed)
[name] [address] [place], [place], [address] Phone: [phone]   Fax:  [phone]  Physical Therapy Treatment  Patient Details  Name: Sara [name] MRN: [id number] Date of Birth: [birth year] Referring Provider (PT): Ramond Marrow, MD   Encounter Date: 07/15/2020   PT End of Session - 07/15/20 1330    Visit Number 8    Number of Visits 16    Date for PT Re-Evaluation 08/26/20    Authorization Type BCBS; FOTO; Progress note every 10th visit    PT Start Time 1115    PT Stop Time 1204    PT Time Calculation (min) 49 min    Activity Tolerance Patient tolerated treatment well;Patient limited by pain    Behavior During Therapy [facility] for tasks assessed/performed           Past Medical History:  Diagnosis Date  . Arthritis   . Proximal humerus fracture 06/03/2020   left    Past Surgical History:  Procedure Laterality Date  . ABDOMINAL HYSTERECTOMY  1986  . calcaneus fracture Left   . FRACTURE SURGERY  1979   fx collar bone  . ORIF HUMERUS FRACTURE Left 06/10/2020   Procedure: OPEN REDUCTION INTERNAL FIXATION (ORIF) PROXIMAL HUMERUS FRACTURE;  Surgeon: Bjorn Pippin, MD;  Location: [facility];  Service: Orthopedics;  Laterality: Left;    There were no vitals filed for this visit.   Subjective Assessment - 07/15/20 1321    Subjective COVID-19 screening performed upon arrival. Patient reports more pain in greater tubercle area after last session.    Pertinent History left proximal humerus ORIF 06/10/2020, sulfa drug allergy    Limitations Lifting;House hold activities    Diagnostic tests x-ray    Patient Stated Goals use arm normally again    Currently in Pain? Yes    Pain Score 9     Pain Location Arm    Pain Orientation Right    Pain Descriptors / Indicators Sore;Aching    Pain Type Surgical pain    Pain Onset More than a month ago    Pain Frequency Intermittent              [facility] PT Assessment -  07/15/20 0001      Assessment   Medical Diagnosis Left 3 part proximal humerus fracture ORIF, proximal humeral shaft fracture    Referring Provider (PT) Ramond Marrow, MD    Onset Date/Surgical Date 06/10/20    Hand Dominance Right    Next MD Visit 07/24/2020    Prior Therapy not for this      Precautions   Precautions Shoulder    Type of Shoulder Precautions follow DR. Varkey's RTC protocol    Required Braces or Orthoses Sling                         [facility] Adult PT Treatment/Exercise - 07/15/20 0001      Modalities   Modalities Vasopneumatic      Vasopneumatic   Number Minutes Vasopneumatic  15 minutes    Vasopnuematic Location  Shoulder    Vasopneumatic Pressure Low    Vasopneumatic Temperature  34      Manual Therapy   Manual Therapy Passive ROM    Manual therapy comments STW/M UT and L posterior cuff    Passive ROM PROM into ER abd and flexion short holds to improve range with frequent oscillations to decrease guarding and pain  PT Long Term Goals - 07/09/20 1123      PT LONG TERM GOAL #1   Title Patient will be independent with HEP    Time 8    Period Weeks    Status On-going      PT LONG TERM GOAL #2   Title Patient will demonstrate 4/5 or greater left shoulder MMT in all planes to improve strength and stability during functional tasks    Time 8    Period Weeks    Status On-going      PT LONG TERM GOAL #3   Title Patient will demonstrate 140+ degrees of left shoulder flexion AROM to improve ability to perform overhead tasks.    Time 8    Period Weeks    Status On-going      PT LONG TERM GOAL #4   Title Patient will demonstrate 65+ degrees of left shoulder ER AROM to improve donning/doffing apparel.    Time 8    Period Weeks    Status On-going      PT LONG TERM GOAL #5   Title Patient will report ability to perform home and work activities independently with left shoulder pain less than or equal to 3/10.     Time 8    Period Weeks    Status On-going                 Plan - 07/15/20 1323    Clinical Impression Statement Patient responded well to therapy though with more pain to right greater tubercle region with PROM. Patinet noted with less guarding today but still with reports of pain at end ranges. Patient responded well to gentle STW/M to posterior cuff and UT with minimal trigger points. No adverse affects upon removal of modalities.    Personal Factors and Comorbidities Comorbidity 2    Comorbidities left proximal humerus ORIF 06/10/2020, sulfa drug allergy    Examination-Activity Limitations Reach Overhead;Dressing    Examination-Participation Restrictions Occupation;Driving    Stability/Clinical Decision Making Stable/Uncomplicated    Clinical Decision Making Low    Rehab Potential Good    PT Frequency 2x / week    PT Duration 8 weeks    PT Treatment/Interventions ADLs/Self Care Home Management;Moist Heat;Electrical Stimulation;Cryotherapy;Functional mobility training;Therapeutic activities;Therapeutic exercise;Neuromuscular re-education;Manual techniques;Passive range of motion;Patient/family education;Scar mobilization;Vasopneumatic Device;Taping    PT Next Visit Plan Per protocol PROM to left shoulder for 6 weeks; modalities PRN for pain relief    PT Home Exercise Plan see patient education section    Consulted and Agree with Plan of Care Patient           Patient will benefit from skilled therapeutic intervention in order to improve the following deficits and impairments:  Decreased range of motion,Decreased activity tolerance,Impaired UE functional use,Pain,Postural dysfunction,Decreased strength  Visit Diagnosis: Acute pain of left shoulder  Stiffness of left shoulder, not elsewhere classified  Muscle weakness (generalized)  Localized edema     Problem List Patient Active Problem List   Diagnosis Date Noted  . Adjustment disorder with mixed anxiety and  depressed mood 05/06/2017  . Tobacco use disorder 02/22/2017  . History of hysterectomy 02/22/2017  . Allergic rhinitis due to allergen 02/22/2017  . Chronic fatigue 02/22/2017  . S/P hysterectomy 09/13/2013    [name] 07/15/2020, 1:30 PM  [facility] [address] [place], [place], [address] Phone: [phone]   Fax:  [phone]  Name: Sara [name] MRN: [id number] Date of Birth: August 15, 1960

## 2020-07-16 ENCOUNTER — Encounter: Payer: BC Managed Care – PPO | Admitting: Physical Therapy

## 2020-07-20 ENCOUNTER — Ambulatory Visit: Payer: BC Managed Care – PPO | Admitting: Physical Therapy

## 2020-07-20 ENCOUNTER — Other Ambulatory Visit: Payer: Self-pay

## 2020-07-20 DIAGNOSIS — M6281 Muscle weakness (generalized): Secondary | ICD-10-CM | POA: Diagnosis not present

## 2020-07-20 DIAGNOSIS — M25612 Stiffness of left shoulder, not elsewhere classified: Secondary | ICD-10-CM | POA: Diagnosis not present

## 2020-07-20 DIAGNOSIS — R600 Localized edema: Secondary | ICD-10-CM | POA: Diagnosis not present

## 2020-07-20 DIAGNOSIS — M25512 Pain in left shoulder: Secondary | ICD-10-CM | POA: Diagnosis not present

## 2020-07-20 NOTE — Therapy (Signed)
[name] [address] [place], [place], [address] Phone: [phone]   Fax:  [phone]  Physical Therapy Treatment  Patient Details  Name: Sara [name] MRN: [id number] Date of Birth: July 24, 1960 Referring Provider (PT): Ramond Marrow, MD   Encounter Date: 07/20/2020   PT End of Session - 07/20/20 1250    Visit Number 9    Number of Visits 16    Date for PT Re-Evaluation 08/26/20    Authorization Type BCBS; FOTO; Progress note every 10th visit    PT Start Time 1115    PT Stop Time 1204    PT Time Calculation (min) 49 min    Equipment Utilized During Treatment Other (comment)   R sling   Activity Tolerance Patient tolerated treatment well;Patient limited by pain    Behavior During Therapy [facility] for tasks assessed/performed           Past Medical History:  Diagnosis Date  . Arthritis   . Proximal humerus fracture 06/03/2020   left    Past Surgical History:  Procedure Laterality Date  . ABDOMINAL HYSTERECTOMY  1986  . calcaneus fracture Left   . FRACTURE SURGERY  1979   fx collar bone  . ORIF HUMERUS FRACTURE Left 06/10/2020   Procedure: OPEN REDUCTION INTERNAL FIXATION (ORIF) PROXIMAL HUMERUS FRACTURE;  Surgeon: Bjorn Pippin, MD;  Location: [facility];  Service: Orthopedics;  Laterality: Left;    There were no vitals filed for this visit.       [facility] PT Assessment - 07/20/20 0001      Assessment   Medical Diagnosis Left 3 part proximal humerus fracture ORIF, proximal humeral shaft fracture    Referring Provider (PT) Ramond Marrow, MD    Onset Date/Surgical Date 06/10/20    Hand Dominance Right    Next MD Visit 07/24/2020    Prior Therapy not for this      Precautions   Precautions Shoulder    Type of Shoulder Precautions follow DR. Varkey's RTC protocol    Required Braces or Orthoses Sling                         [facility] Adult PT Treatment/Exercise - 07/20/20 0001      Modalities    Modalities Vasopneumatic      Vasopneumatic   Number Minutes Vasopneumatic  15 minutes    Vasopnuematic Location  Shoulder    Vasopneumatic Pressure Low    Vasopneumatic Temperature  34      Manual Therapy   Manual Therapy Passive ROM    Manual therapy comments STW/M UT and L posterior cuff    Passive ROM PROM into ER abd and flexion short holds to improve range with frequent oscillations to decrease guarding and pain                       PT Long Term Goals - 07/09/20 1123      PT LONG TERM GOAL #1   Title Patient will be independent with HEP    Time 8    Period Weeks    Status On-going      PT LONG TERM GOAL #2   Title Patient will demonstrate 4/5 or greater left shoulder MMT in all planes to improve strength and stability during functional tasks    Time 8    Period Weeks    Status On-going      PT LONG TERM  GOAL #3   Title Patient will demonstrate 140+ degrees of left shoulder flexion AROM to improve ability to perform overhead tasks.    Time 8    Period Weeks    Status On-going      PT LONG TERM GOAL #4   Title Patient will demonstrate 65+ degrees of left shoulder ER AROM to improve donning/doffing apparel.    Time 8    Period Weeks    Status On-going      PT LONG TERM GOAL #5   Title Patient will report ability to perform home and work activities independently with left shoulder pain less than or equal to 3/10.    Time 8    Period Weeks    Status On-going                 Plan - 07/20/20 1250    Clinical Impression Statement Patient arrives with ongoing stiffness and tightness in posterior cuff. PROM performed with good response but with ongoing guarding. Patient provided with verbal cuing for breathing techniques and prevent trunk rotation. Measurements next visit for MD follow up. No adverse affects upon removal of modalities.    Personal Factors and Comorbidities Comorbidity 2    Comorbidities left proximal humerus ORIF 06/10/2020, sulfa  drug allergy    Examination-Activity Limitations Reach Overhead;Dressing    Examination-Participation Restrictions Occupation;Driving    Stability/Clinical Decision Making Stable/Uncomplicated    Clinical Decision Making Low    Rehab Potential Good    PT Frequency 2x / week    PT Duration 8 weeks    PT Treatment/Interventions ADLs/Self Care Home Management;Moist Heat;Electrical Stimulation;Cryotherapy;Functional mobility training;Therapeutic activities;Therapeutic exercise;Neuromuscular re-education;Manual techniques;Passive range of motion;Patient/family education;Scar mobilization;Vasopneumatic Device;Taping    PT Next Visit Plan Per protocol PROM to left shoulder for 6 weeks; modalities PRN for pain relief    PT Home Exercise Plan see patient education section    Consulted and Agree with Plan of Care Patient           Patient will benefit from skilled therapeutic intervention in order to improve the following deficits and impairments:  Decreased range of motion,Decreased activity tolerance,Impaired UE functional use,Pain,Postural dysfunction,Decreased strength  Visit Diagnosis: Acute pain of left shoulder  Stiffness of left shoulder, not elsewhere classified  Muscle weakness (generalized)  Localized edema     Problem List Patient Active Problem List   Diagnosis Date Noted  . Adjustment disorder with mixed anxiety and depressed mood 05/06/2017  . Tobacco use disorder 02/22/2017  . History of hysterectomy 02/22/2017  . Allergic rhinitis due to allergen 02/22/2017  . Chronic fatigue 02/22/2017  . S/P hysterectomy 09/13/2013    [name], PT, DPT 07/20/2020, 1:01 PM  [facility] [address] [place], [place], [address] Phone: [phone]   Fax:  [phone]  Name: Sara [name] MRN: [id number] Date of Birth: 1961-01-21

## 2020-07-21 ENCOUNTER — Ambulatory Visit: Payer: BC Managed Care – PPO | Admitting: Physical Therapy

## 2020-07-21 ENCOUNTER — Other Ambulatory Visit: Payer: Self-pay

## 2020-07-21 DIAGNOSIS — R600 Localized edema: Secondary | ICD-10-CM | POA: Diagnosis not present

## 2020-07-21 DIAGNOSIS — M25512 Pain in left shoulder: Secondary | ICD-10-CM

## 2020-07-21 DIAGNOSIS — M25612 Stiffness of left shoulder, not elsewhere classified: Secondary | ICD-10-CM | POA: Diagnosis not present

## 2020-07-21 DIAGNOSIS — M6281 Muscle weakness (generalized): Secondary | ICD-10-CM

## 2020-07-21 NOTE — Therapy (Signed)
[name] [address] [place], [place], [address] Phone: [phone]   Fax:  [phone]  Physical Therapy Treatment Progress Note Reporting Period 06/24/2020 to 07/21/2020  See note below for Objective Data and Assessment of Progress/Goals. Patient is making slow progressions with ROM; patient is 6 weeks post op and should begin to progress through protocol next visit.       Patient Details  Name: Sara [name] MRN: [id number] Date of Birth: 01/12/1961 Referring Provider (PT): Ramond Marrow, MD   Encounter Date: 07/21/2020   PT End of Session - 07/21/20 1151    Visit Number 10    Number of Visits 16    Date for PT Re-Evaluation 08/26/20    Authorization Type BCBS; FOTO; Progress note every 10th visit    PT Start Time 1115    PT Stop Time 1204    PT Time Calculation (min) 49 min    Activity Tolerance Patient tolerated treatment well;Patient limited by pain    Behavior During Therapy [facility] for tasks assessed/performed           Past Medical History:  Diagnosis Date  . Arthritis   . Proximal humerus fracture 06/03/2020   left    Past Surgical History:  Procedure Laterality Date  . ABDOMINAL HYSTERECTOMY  1986  . calcaneus fracture Left   . FRACTURE SURGERY  1979   fx collar bone  . ORIF HUMERUS FRACTURE Left 06/10/2020   Procedure: OPEN REDUCTION INTERNAL FIXATION (ORIF) PROXIMAL HUMERUS FRACTURE;  Surgeon: Bjorn Pippin, MD;  Location: [facility];  Service: Orthopedics;  Laterality: Left;    There were no vitals filed for this visit.   Subjective Assessment - 07/21/20 1251    Subjective COVID-19 screening performed upon arrival. Patient reports 7/10 pain in left shoulder    Pertinent History left proximal humerus ORIF 06/10/2020, sulfa drug allergy    Limitations Lifting;House hold activities    Diagnostic tests x-Sara    Patient Stated Goals use arm normally again    Currently in Pain? Yes    Pain Score 7      Pain Location Arm    Pain Orientation Left    Pain Descriptors / Indicators Sore    Pain Type Surgical pain    Pain Onset More than a month ago    Pain Frequency Intermittent              [facility] PT Assessment - 07/21/20 0001      Assessment   Medical Diagnosis Left 3 part proximal humerus fracture ORIF, proximal humeral shaft fracture    Referring Provider (PT) Ramond Marrow, MD    Onset Date/Surgical Date 06/10/20    Hand Dominance Right    Next MD Visit 07/24/2020    Prior Therapy not for this      Precautions   Precautions Shoulder    Type of Shoulder Precautions follow DR. Varkey's RTC protocol    Required Braces or Orthoses Sling      PROM   Left Shoulder Flexion 119 Degrees    Left Shoulder ABduction 88 Degrees    Left Shoulder External Rotation 44 Degrees                         [facility] Adult PT Treatment/Exercise - 07/21/20 0001      Modalities   Modalities Vasopneumatic      Vasopneumatic   Number Minutes Vasopneumatic  15 minutes  Vasopnuematic Location  Shoulder    Vasopneumatic Pressure Low    Vasopneumatic Temperature  34      Manual Therapy   Manual Therapy Passive ROM    Manual therapy comments STW/M UT and L posterior cuff    Passive ROM PROM into ER abd and flexion short holds to improve range with frequent oscillations to decrease guarding and pain                       PT Long Term Goals - 07/09/20 1123      PT LONG TERM GOAL #1   Title Patient will be independent with HEP    Time 8    Period Weeks    Status On-going      PT LONG TERM GOAL #2   Title Patient will demonstrate 4/5 or greater left shoulder MMT in all planes to improve strength and stability during functional tasks    Time 8    Period Weeks    Status On-going      PT LONG TERM GOAL #3   Title Patient will demonstrate 140+ degrees of left shoulder flexion AROM to improve ability to perform overhead tasks.    Time 8    Period Weeks    Status  On-going      PT LONG TERM GOAL #4   Title Patient will demonstrate 65+ degrees of left shoulder ER AROM to improve donning/doffing apparel.    Time 8    Period Weeks    Status On-going      PT LONG TERM GOAL #5   Title Patient will report ability to perform home and work activities independently with left shoulder pain less than or equal to 3/10.    Time 8    Period Weeks    Status On-going                 Plan - 07/21/20 1216    Clinical Impression Statement Patient arrives with 7/10 pain in left shoulder. Patient with improvements with left shoulder PROM, see measurements. Patient to see MD for follow up on Friday. Patient instructed to inform next visit of any changes. Patient reported undrestanding. No adverse affects upon removal of modalities.    Personal Factors and Comorbidities Comorbidity 2    Comorbidities left proximal humerus ORIF 06/10/2020, sulfa drug allergy    Examination-Activity Limitations Reach Overhead;Dressing    Examination-Participation Restrictions Occupation;Driving    Stability/Clinical Decision Making Stable/Uncomplicated    Clinical Decision Making Low    Rehab Potential Good    PT Frequency 2x / week    PT Duration 8 weeks    PT Treatment/Interventions ADLs/Self Care Home Management;Moist Heat;Electrical Stimulation;Cryotherapy;Functional mobility training;Therapeutic activities;Therapeutic exercise;Neuromuscular re-education;Manual techniques;Passive range of motion;Patient/family education;Scar mobilization;Vasopneumatic Device;Taping    PT Next Visit Plan Per protocol PROM to left shoulder for 6 weeks ; modalities PRN for pain relief    PT Home Exercise Plan see patient education section    Consulted and Agree with Plan of Care Patient           Patient will benefit from skilled therapeutic intervention in order to improve the following deficits and impairments:  Decreased range of motion,Decreased activity tolerance,Impaired UE functional  use,Pain,Postural dysfunction,Decreased strength  Visit Diagnosis: Acute pain of left shoulder  Stiffness of left shoulder, not elsewhere classified  Muscle weakness (generalized)  Localized edema     Problem List Patient Active Problem List   Diagnosis Date Noted  .  Adjustment disorder with mixed anxiety and depressed mood 05/06/2017  . Tobacco use disorder 02/22/2017  . History of hysterectomy 02/22/2017  . Allergic rhinitis due to allergen 02/22/2017  . Chronic fatigue 02/22/2017  . S/P hysterectomy 09/13/2013    [name] 07/21/2020, 12:52 PM  [facility] [address] [place], [place], [address] Phone: [phone]   Fax:  [phone]  Name: Sara [name] MRN: [id number] Date of Birth: 1960-10-05

## 2020-07-27 ENCOUNTER — Encounter: Payer: Self-pay | Admitting: Physical Therapy

## 2020-07-27 ENCOUNTER — Other Ambulatory Visit: Payer: Self-pay

## 2020-07-27 ENCOUNTER — Ambulatory Visit: Payer: BC Managed Care – PPO | Admitting: Physical Therapy

## 2020-07-27 DIAGNOSIS — M6281 Muscle weakness (generalized): Secondary | ICD-10-CM

## 2020-07-27 DIAGNOSIS — M25612 Stiffness of left shoulder, not elsewhere classified: Secondary | ICD-10-CM

## 2020-07-27 DIAGNOSIS — M25512 Pain in left shoulder: Secondary | ICD-10-CM | POA: Diagnosis not present

## 2020-07-27 DIAGNOSIS — R600 Localized edema: Secondary | ICD-10-CM

## 2020-07-27 NOTE — Therapy (Signed)
[name] [address] [place], [place], [address] Phone: [phone]   Fax:  [phone]  Physical Therapy Treatment  Patient Details  Name: Sara [name] MRN: [id number] Date of Birth: 1960-11-17 Referring Provider (PT): Ramond Marrow, MD   Encounter Date: 07/27/2020   PT End of Session - 07/27/20 1449    Visit Number 11    Number of Visits 16    Date for PT Re-Evaluation 08/26/20    Authorization Type BCBS; FOTO; Progress note every 10th visit    PT Start Time 1430    PT Stop Time 1520    PT Time Calculation (min) 50 min    Activity Tolerance Patient tolerated treatment well;Patient limited by pain    Behavior During Therapy [facility] for tasks assessed/performed           Past Medical History:  Diagnosis Date  . Arthritis   . Proximal humerus fracture 06/03/2020   left    Past Surgical History:  Procedure Laterality Date  . ABDOMINAL HYSTERECTOMY  1986  . calcaneus fracture Left   . FRACTURE SURGERY  1979   fx collar bone  . ORIF HUMERUS FRACTURE Left 06/10/2020   Procedure: OPEN REDUCTION INTERNAL FIXATION (ORIF) PROXIMAL HUMERUS FRACTURE;  Surgeon: Bjorn Pippin, MD;  Location: [facility];  Service: Orthopedics;  Laterality: Left;    There were no vitals filed for this visit.   Subjective Assessment - 07/27/20 1447    Subjective COVID-19 screening performed upon arrival. Patient repoorts MD follow up went fairly well, just begin more aggressive PT for ROM and can lift no greater than 5 lbs. Patient reports about an 8/10 and some sensitivity on the top of the scar.    Pertinent History left proximal humerus ORIF 06/10/2020, sulfa drug allergy    Limitations Lifting;House hold activities    Diagnostic tests x-ray    Patient Stated Goals use arm normally again    Currently in Pain? Yes    Pain Score 8     Pain Location Arm    Pain Orientation Upper;Left    Pain Descriptors / Indicators Aching;Sharp     Pain Type Surgical pain    Pain Onset More than a month ago    Pain Frequency Intermittent              [facility] PT Assessment - 07/27/20 0001      Assessment   Medical Diagnosis Left 3 part proximal humerus fracture ORIF, proximal humeral shaft fracture    Referring Provider (PT) Ramond Marrow, MD    Onset Date/Surgical Date 06/10/20    Hand Dominance Right    Next MD Visit 08/18/2020    Prior Therapy not for this      Precautions   Precautions Shoulder    Type of Shoulder Precautions follow DR. Varkey's RTC protocol    Required Braces or Orthoses Sling                         [facility] Adult PT Treatment/Exercise - 07/27/20 0001      Exercises   Exercises Shoulder      Shoulder Exercises: Standing   Other Standing Exercises bilateral shoulder rolls, fwd/bwd x20 each      Shoulder Exercises: Pulleys   Flexion 5 minutes      Shoulder Exercises: ROM/Strengthening   Ranger seated ranger flexion CW, CCW left shoulder x2 min each (6 mins total)  Modalities   Modalities Vasopneumatic      Vasopneumatic   Number Minutes Vasopneumatic  15 minutes    Vasopnuematic Location  Shoulder    Vasopneumatic Pressure Low    Vasopneumatic Temperature  34      Manual Therapy   Manual Therapy Passive ROM    Manual therapy comments STW/M UT and L posterior cuff    Passive ROM PROM into ER abd and flexion short holds to improve range with frequent oscillations to decrease guarding and pain                  PT Education - 07/27/20 1519    Education Details table flexion slides, table circles, pulleys    Methods Demonstration;Explanation;Handout    Comprehension Verbalized understanding               PT Long Term Goals - 07/09/20 1123      PT LONG TERM GOAL #1   Title Patient will be independent with HEP    Time 8    Period Weeks    Status On-going      PT LONG TERM GOAL #2   Title Patient will demonstrate 4/5 or greater left shoulder MMT in all  planes to improve strength and stability during functional tasks    Time 8    Period Weeks    Status On-going      PT LONG TERM GOAL #3   Title Patient will demonstrate 140+ degrees of left shoulder flexion AROM to improve ability to perform overhead tasks.    Time 8    Period Weeks    Status On-going      PT LONG TERM GOAL #4   Title Patient will demonstrate 65+ degrees of left shoulder ER AROM to improve donning/doffing apparel.    Time 8    Period Weeks    Status On-going      PT LONG TERM GOAL #5   Title Patient will report ability to perform home and work activities independently with left shoulder pain less than or equal to 3/10.    Time 8    Period Weeks    Status On-going                 Plan - 07/27/20 1520    Clinical Impression Statement Patient arrived with 8/10 pain in left shoulder. Patient guided through new AAROM TEs with reports of pain. Patient still with stiffness with PROM but improvements with muscle guarding. Incision is well healed with some areas where glue is still present. Notable scar tissue palpated beyond incisional lines. Patient and PT discussed progressing through protocol and discussed more sorness as we begin to perfrom new exercises. Patient reported understanding.    Personal Factors and Comorbidities Comorbidity 2    Comorbidities left proximal humerus ORIF 06/10/2020, sulfa drug allergy    Examination-Activity Limitations Reach Overhead;Dressing    Examination-Participation Restrictions Occupation;Driving    Stability/Clinical Decision Making Stable/Uncomplicated    Clinical Decision Making Low    Rehab Potential Good    PT Frequency 2x / week    PT Duration 8 weeks    PT Treatment/Interventions ADLs/Self Care Home Management;Moist Heat;Electrical Stimulation;Cryotherapy;Functional mobility training;Therapeutic activities;Therapeutic exercise;Neuromuscular re-education;Manual techniques;Passive range of motion;Patient/family  education;Scar mobilization;Vasopneumatic Device;Taping    PT Next Visit Plan AAROM to left shoulder, PROM to improve ROM, modalities PRN for pain relief    PT Home Exercise Plan see patient education section    Consulted and Agree with Plan of  Care Patient           Patient will benefit from skilled therapeutic intervention in order to improve the following deficits and impairments:  Decreased range of motion,Decreased activity tolerance,Impaired UE functional use,Pain,Postural dysfunction,Decreased strength  Visit Diagnosis: Acute pain of left shoulder  Stiffness of left shoulder, not elsewhere classified  Localized edema  Muscle weakness (generalized)     Problem List Patient Active Problem List   Diagnosis Date Noted  . Adjustment disorder with mixed anxiety and depressed mood 05/06/2017  . Tobacco use disorder 02/22/2017  . History of hysterectomy 02/22/2017  . Allergic rhinitis due to allergen 02/22/2017  . Chronic fatigue 02/22/2017  . S/P hysterectomy 09/13/2013    [name], PT, DPT 07/27/2020, 3:29 PM  [name] [address] [place], [place], [address] Phone: [phone]   Fax:  [phone]  Name: Sara [name] MRN: [id number] Date of Birth: 24-Dec-1960

## 2020-07-28 ENCOUNTER — Ambulatory Visit: Payer: BC Managed Care – PPO | Admitting: Physical Therapy

## 2020-07-30 ENCOUNTER — Other Ambulatory Visit: Payer: Self-pay

## 2020-07-30 ENCOUNTER — Ambulatory Visit: Payer: BC Managed Care – PPO | Admitting: Physical Therapy

## 2020-07-30 DIAGNOSIS — R600 Localized edema: Secondary | ICD-10-CM

## 2020-07-30 DIAGNOSIS — M6281 Muscle weakness (generalized): Secondary | ICD-10-CM

## 2020-07-30 DIAGNOSIS — M25612 Stiffness of left shoulder, not elsewhere classified: Secondary | ICD-10-CM

## 2020-07-30 DIAGNOSIS — M25512 Pain in left shoulder: Secondary | ICD-10-CM

## 2020-07-30 NOTE — Therapy (Signed)
[name] [address] [place], [place], [address] Phone: [phone]   Fax:  [phone]  Physical Therapy Treatment  Patient Details  Name: Sara [name] MRN: [id number] Date of Birth: 09/05/1960 Referring Provider (PT): Ramond Marrow, MD   Encounter Date: 07/30/2020   PT End of Session - 07/30/20 1204    Visit Number 11   error in count   Number of Visits 16    Date for PT Re-Evaluation 08/26/20    Authorization Type BCBS; FOTO; Progress note every 10th visit    PT Start Time 1115    PT Stop Time 1202    PT Time Calculation (min) 47 min    Activity Tolerance Patient tolerated treatment well;Patient limited by pain    Behavior During Therapy [facility] for tasks assessed/performed           Past Medical History:  Diagnosis Date  . Arthritis   . Proximal humerus fracture 06/03/2020   left    Past Surgical History:  Procedure Laterality Date  . ABDOMINAL HYSTERECTOMY  1986  . calcaneus fracture Left   . FRACTURE SURGERY  1979   fx collar bone  . ORIF HUMERUS FRACTURE Left 06/10/2020   Procedure: OPEN REDUCTION INTERNAL FIXATION (ORIF) PROXIMAL HUMERUS FRACTURE;  Surgeon: Bjorn Pippin, MD;  [facility];  Service: Orthopedics;  Laterality: Left;    There were no vitals filed for this visit.   Subjective Assessment - 07/30/20 1205    Subjective COVID-19 screening performed upon arrival. Patient reports more biceps pain that makes it difficulty for her to sleep.    Pertinent History left proximal humerus ORIF 06/10/2020, sulfa drug allergy    Limitations Lifting;House hold activities    Diagnostic tests x-ray    Patient Stated Goals use arm normally again    Currently in Pain? Yes   did not provide number on pain scale             [facility] PT Assessment - 07/30/20 0001      Assessment   Medical Diagnosis Left 3 part proximal humerus fracture ORIF, proximal humeral shaft fracture    Referring Provider  (PT) Ramond Marrow, MD    Onset Date/Surgical Date 06/10/20    Hand Dominance Right    Next MD Visit 08/18/2020    Prior Therapy not for this      Precautions   Precautions Shoulder    Type of Shoulder Precautions follow DR. Varkey's RTC protocol    Required Braces or Orthoses Sling                         [facility] Adult PT Treatment/Exercise - 07/30/20 0001      Exercises   Exercises Shoulder      Shoulder Exercises: Supine   Protraction AAROM;Both;20 reps    External Rotation AAROM;Left;20 reps    External Rotation Limitations with PVC      Shoulder Exercises: Pulleys   Flexion 5 minutes      Shoulder Exercises: ROM/Strengthening   Ranger seated ranger flexion CW, CCW left shoulder x3 min each (9 mins total)      Modalities   Modalities Vasopneumatic      Vasopneumatic   Number Minutes Vasopneumatic  15 minutes    Vasopnuematic Location  Shoulder    Vasopneumatic Pressure Low    Vasopneumatic Temperature  34      Manual Therapy   Manual Therapy Passive ROM  Manual therapy comments STW/M UT and L posterior cuff    Passive ROM PROM into ER abd and flexion short holds to improve range with frequent oscillations to decrease guarding and pain                       PT Long Term Goals - 07/09/20 1123      PT LONG TERM GOAL #1   Title Patient will be independent with HEP    Time 8    Period Weeks    Status On-going      PT LONG TERM GOAL #2   Title Patient will demonstrate 4/5 or greater left shoulder MMT in all planes to improve strength and stability during functional tasks    Time 8    Period Weeks    Status On-going      PT LONG TERM GOAL #3   Title Patient will demonstrate 140+ degrees of left shoulder flexion AROM to improve ability to perform overhead tasks.    Time 8    Period Weeks    Status On-going      PT LONG TERM GOAL #4   Title Patient will demonstrate 65+ degrees of left shoulder ER AROM to improve donning/doffing  apparel.    Time 8    Period Weeks    Status On-going      PT LONG TERM GOAL #5   Title Patient will report ability to perform home and work activities independently with left shoulder pain less than or equal to 3/10.    Time 8    Period Weeks    Status On-going                 Plan - 07/30/20 1206    Clinical Impression Statement Patient arrived with more pain in biceps region but overall responded well to session. Rest breaks provided secondary to pain. Patient provided with intermittent tactile cuing for technique with supine AAROM ER. Patient still with guarding with PROM; oscillations provided to decrease guarding and pain. No adverse affects upon removal of modalities.    Personal Factors and Comorbidities Comorbidity 2    Comorbidities left proximal humerus ORIF 06/10/2020, sulfa drug allergy    Examination-Activity Limitations Reach Overhead;Dressing    Examination-Participation Restrictions Occupation;Driving    Stability/Clinical Decision Making Stable/Uncomplicated    Clinical Decision Making Low    Rehab Potential Good    PT Frequency 2x / week    PT Duration 8 weeks    PT Treatment/Interventions ADLs/Self Care Home Management;Moist Heat;Electrical Stimulation;Cryotherapy;Functional mobility training;Therapeutic activities;Therapeutic exercise;Neuromuscular re-education;Manual techniques;Passive range of motion;Patient/family education;Scar mobilization;Vasopneumatic Device;Taping    PT Next Visit Plan AAROM to left shoulder, PROM to improve ROM, modalities PRN for pain relief    PT Home Exercise Plan see patient education section    Consulted and Agree with Plan of Care Patient           Patient will benefit from skilled therapeutic intervention in order to improve the following deficits and impairments:  Decreased range of motion,Decreased activity tolerance,Impaired UE functional use,Pain,Postural dysfunction,Decreased strength  Visit Diagnosis: Acute pain of  left shoulder  Stiffness of left shoulder, not elsewhere classified  Localized edema  Muscle weakness (generalized)     Problem List Patient Active Problem List   Diagnosis Date Noted  . Adjustment disorder with mixed anxiety and depressed mood 05/06/2017  . Tobacco use disorder 02/22/2017  . History of hysterectomy 02/22/2017  . Allergic rhinitis due to allergen  02/22/2017  . Chronic fatigue 02/22/2017  . S/P hysterectomy 09/13/2013    [name], PT, DPT 07/30/2020, 12:55 PM  [name] [address] [place], [place], [address] Phone: [phone]   Fax:  [phone]  Name: Sara [name] MRN: [id number] Date of Birth: 1960/08/12

## 2020-08-04 ENCOUNTER — Other Ambulatory Visit: Payer: Self-pay

## 2020-08-04 ENCOUNTER — Encounter: Payer: Self-pay | Admitting: Physical Therapy

## 2020-08-04 ENCOUNTER — Ambulatory Visit: Payer: BC Managed Care – PPO | Admitting: Physical Therapy

## 2020-08-04 DIAGNOSIS — M25512 Pain in left shoulder: Secondary | ICD-10-CM | POA: Diagnosis not present

## 2020-08-04 DIAGNOSIS — M6281 Muscle weakness (generalized): Secondary | ICD-10-CM | POA: Diagnosis not present

## 2020-08-04 DIAGNOSIS — M25612 Stiffness of left shoulder, not elsewhere classified: Secondary | ICD-10-CM | POA: Diagnosis not present

## 2020-08-04 DIAGNOSIS — R600 Localized edema: Secondary | ICD-10-CM

## 2020-08-04 NOTE — Therapy (Signed)
[name] [address] [place], [place], [address] Phone: [phone]   Fax:  [phone]  Physical Therapy Treatment  Patient Details  Name: Sara [name] MRN: [id number] Date of Birth: April 28, 1961 Referring Provider (PT): Ramond Marrow, MD   Encounter Date: 08/04/2020   PT End of Session - 08/04/20 1526    Visit Number 12    Number of Visits 16    Date for PT Re-Evaluation 08/26/20    Authorization Type BCBS; FOTO; Progress note every 10th visit    PT Start Time 1430    PT Stop Time 1529    PT Time Calculation (min) 59 min    Activity Tolerance Patient tolerated treatment well;Patient limited by pain    Behavior During Therapy [facility] for tasks assessed/performed           Past Medical History:  Diagnosis Date  . Arthritis   . Proximal humerus fracture 06/03/2020   left    Past Surgical History:  Procedure Laterality Date  . ABDOMINAL HYSTERECTOMY  1986  . calcaneus fracture Left   . FRACTURE SURGERY  1979   fx collar bone  . ORIF HUMERUS FRACTURE Left 06/10/2020   Procedure: OPEN REDUCTION INTERNAL FIXATION (ORIF) PROXIMAL HUMERUS FRACTURE;  Surgeon: Bjorn Pippin, MD;  Location: [facility];  Service: Orthopedics;  Laterality: Left;    There were no vitals filed for this visit.   Subjective Assessment - 08/04/20 1523    Subjective COVID-19 screening performed upon arrival. Patient reports 10/10 pain in biceps region but states it could be more from doing the table slides.    Pertinent History left proximal humerus ORIF 06/10/2020, sulfa drug allergy    Limitations Lifting;House hold activities    Diagnostic tests x-ray    Currently in Pain? Yes    Pain Score 10-Worst pain ever    Pain Location Arm    Pain Orientation Upper;Left    Pain Descriptors / Indicators Sore;Tender    Pain Type Surgical pain    Pain Onset More than a month ago    Pain Frequency Constant              [facility] PT Assessment -  08/04/20 0001      Assessment   Medical Diagnosis Left 3 part proximal humerus fracture ORIF, proximal humeral shaft fracture    Referring Provider (PT) Ramond Marrow, MD    Onset Date/Surgical Date 06/10/20    Hand Dominance Right    Next MD Visit 08/18/2020    Prior Therapy not for this      Precautions   Precautions Shoulder    Type of Shoulder Precautions follow DR. Varkey's RTC protocol    Required Braces or Orthoses Sling      PROM   Left Shoulder Flexion 124 Degrees    Left Shoulder External Rotation 49 Degrees                         [facility] Adult PT Treatment/Exercise - 08/04/20 0001      Exercises   Exercises Shoulder      Shoulder Exercises: Supine   Protraction AAROM;Both;20 reps    Flexion AAROM;Both;20 reps      Shoulder Exercises: Pulleys   Flexion 5 minutes      Shoulder Exercises: ROM/Strengthening   Ranger seated ranger flexion CW, CCW left shoulder x3 min each (9 mins total)    Other ROM/Strengthening Exercises wall ladder level 20 x10  with 3" hold   right unaffected to level 26     Modalities   Modalities Vasopneumatic      Vasopneumatic   Number Minutes Vasopneumatic  10 minutes    Vasopnuematic Location  Shoulder    Vasopneumatic Pressure Low    Vasopneumatic Temperature  34      Manual Therapy   Manual Therapy Passive ROM    Manual therapy comments STW/M UT and L posterior cuff    Passive ROM PROM into ER abd and flexion short holds to improve range with frequent oscillations to decrease guarding and pain                       PT Long Term Goals - 07/09/20 1123      PT LONG TERM GOAL #1   Title Patient will be independent with HEP    Time 8    Period Weeks    Status On-going      PT LONG TERM GOAL #2   Title Patient will demonstrate 4/5 or greater left shoulder MMT in all planes to improve strength and stability during functional tasks    Time 8    Period Weeks    Status On-going      PT LONG TERM GOAL #3    Title Patient will demonstrate 140+ degrees of left shoulder flexion AROM to improve ability to perform overhead tasks.    Time 8    Period Weeks    Status On-going      PT LONG TERM GOAL #4   Title Patient will demonstrate 65+ degrees of left shoulder ER AROM to improve donning/doffing apparel.    Time 8    Period Weeks    Status On-going      PT LONG TERM GOAL #5   Title Patient will report ability to perform home and work activities independently with left shoulder pain less than or equal to 3/10.    Time 8    Period Weeks    Status On-going                 Plan - 08/04/20 1527    Clinical Impression Statement Patient arrived with more left shoulder/arm pain, particularly in the biceps region. Patient guided through TEs and AAROM to which she required intermittent tactile cuing for form and technique. Patient and PT discussed at length the importance of balancing exercises and stretching so shoulder/arm isn't so sore. Patient reported understanding. Patient has made improvements with ROM, see measurements. No adverse affects upon removal of modalities.    Personal Factors and Comorbidities Comorbidity 2    Comorbidities left proximal humerus ORIF 06/10/2020, sulfa drug allergy    Examination-Activity Limitations Reach Overhead;Dressing    Examination-Participation Restrictions Occupation;Driving    Stability/Clinical Decision Making Stable/Uncomplicated    Clinical Decision Making Low    Rehab Potential Good    PT Frequency 2x / week    PT Duration 8 weeks    PT Treatment/Interventions ADLs/Self Care Home Management;Moist Heat;Electrical Stimulation;Cryotherapy;Functional mobility training;Therapeutic activities;Therapeutic exercise;Neuromuscular re-education;Manual techniques;Passive range of motion;Patient/family education;Scar mobilization;Vasopneumatic Device;Taping    PT Next Visit Plan AAROM to left shoulder, PROM to improve ROM, modalities PRN for pain relief    PT  Home Exercise Plan see patient education section    Consulted and Agree with Plan of Care Patient           Patient will benefit from skilled therapeutic intervention in order to improve the following deficits  and impairments:  Decreased range of motion,Decreased activity tolerance,Impaired UE functional use,Pain,Postural dysfunction,Decreased strength  Visit Diagnosis: Acute pain of left shoulder  Stiffness of left shoulder, not elsewhere classified  Localized edema  Muscle weakness (generalized)     Problem List Patient Active Problem List   Diagnosis Date Noted  . Adjustment disorder with mixed anxiety and depressed mood 05/06/2017  . Tobacco use disorder 02/22/2017  . History of hysterectomy 02/22/2017  . Allergic rhinitis due to allergen 02/22/2017  . Chronic fatigue 02/22/2017  . S/P hysterectomy 09/13/2013    [name], PT, DPT 08/04/2020, 3:30 PM  [name] [address] [place], [place], [address] Phone: [phone]   Fax:  [phone]  Name: Sara [name] MRN: [id number] Date of Birth: 09-15-1960

## 2020-08-06 ENCOUNTER — Other Ambulatory Visit: Payer: Self-pay

## 2020-08-06 ENCOUNTER — Encounter: Payer: Self-pay | Admitting: Physical Therapy

## 2020-08-06 ENCOUNTER — Ambulatory Visit: Payer: BC Managed Care – PPO | Admitting: Physical Therapy

## 2020-08-06 DIAGNOSIS — R600 Localized edema: Secondary | ICD-10-CM

## 2020-08-06 DIAGNOSIS — M25512 Pain in left shoulder: Secondary | ICD-10-CM

## 2020-08-06 DIAGNOSIS — M25612 Stiffness of left shoulder, not elsewhere classified: Secondary | ICD-10-CM

## 2020-08-06 DIAGNOSIS — M6281 Muscle weakness (generalized): Secondary | ICD-10-CM

## 2020-08-06 NOTE — Therapy (Signed)
[name] [address] [place], [place], [address] Phone: [phone]   Fax:  [phone]  Physical Therapy Treatment  Patient Details  Name: Sara [name]. Terlecki MRN: [id number] Date of Birth: January 19, 1961 Referring Provider (PT): Ramond Marrow, MD    Encounter Date: 08/06/2020   PT End of Session - 08/06/20 1525    Visit Number 13    Number of Visits 16    Date for PT Re-Evaluation 08/26/20    Authorization Type BCBS; FOTO; Progress note every 10th visit    PT Start Time 1430    PT Stop Time 1516    PT Time Calculation (min) 46 min    Activity Tolerance Patient tolerated treatment well;Patient limited by pain    Behavior During Therapy [facility] for tasks assessed/performed           Past Medical History:  Diagnosis Date  . Arthritis   . Proximal humerus fracture 06/03/2020   left    Past Surgical History:  Procedure Laterality Date  . ABDOMINAL HYSTERECTOMY  1986  . calcaneus fracture Left   . FRACTURE SURGERY  1979   fx collar bone  . ORIF HUMERUS FRACTURE Left 06/10/2020   Procedure: OPEN REDUCTION INTERNAL FIXATION (ORIF) PROXIMAL HUMERUS FRACTURE;  Surgeon: Bjorn Pippin, MD;  Location: [facility];  Service: Orthopedics;  Laterality: Left;    There were no vitals filed for this visit.   Subjective Assessment - 08/06/20 1522    Subjective COVID-19 screening performed upon arrival. Patient reports feeling better 8/10 today. Reports quitting smoking cold [place] today.    Pertinent History left proximal humerus ORIF 06/10/2020, sulfa drug allergy    Limitations Lifting;House hold activities    Diagnostic tests x-ray    Patient Stated Goals use arm normally again    Currently in Pain? Yes    Pain Score 8     Pain Location Arm    Pain Orientation Left;Upper    Pain Descriptors / Indicators Sore    Pain Type Surgical pain    Pain Onset More than a month ago    Pain Frequency Constant              [facility] PT  Assessment - 08/06/20 0001      Assessment   Medical Diagnosis Left 3 part proximal humerus fracture ORIF, proximal humeral shaft fracture    Referring Provider (PT) Ramond Marrow, MD    Onset Date/Surgical Date 06/10/20    Hand Dominance Right    Next MD Visit 08/18/2020    Prior Therapy not for this      Precautions   Precautions Shoulder    Type of Shoulder Precautions follow DR. Varkey's RTC protocol    Required Braces or Orthoses Sling                         [facility] Adult PT Treatment/Exercise - 08/06/20 0001      Exercises   Exercises Shoulder      Shoulder Exercises: Pulleys   Flexion 5 minutes      Shoulder Exercises: ROM/Strengthening   Wall Wash flexion, CW, CCW x3 mins each      Shoulder Exercises: Stretch   Corner Stretch 3 reps;30 seconds    Internal Rotation Stretch 30 seconds   x3 reps   External Rotation Stretch 3 reps;30 seconds   in doorway     Modalities   Modalities Vasopneumatic      Vasopneumatic  Number Minutes Vasopneumatic  10 minutes    Vasopnuematic Location  Shoulder    Vasopneumatic Pressure Low    Vasopneumatic Temperature  34      Manual Therapy   Manual Therapy Passive ROM    Manual therapy comments STW/M UT and L posterior cuff    Passive ROM PROM into ER abd and flexion 5-10 second holds to improve range with frequent oscillations to decrease guarding and pain                       PT Long Term Goals - 07/09/20 1123      PT LONG TERM GOAL #1   Title Patient will be independent with HEP    Time 8    Period Weeks    Status On-going      PT LONG TERM GOAL #2   Title Patient will demonstrate 4/5 or greater left shoulder MMT in all planes to improve strength and stability during functional tasks    Time 8    Period Weeks    Status On-going      PT LONG TERM GOAL #3   Title Patient will demonstrate 140+ degrees of left shoulder flexion AROM to improve ability to perform overhead tasks.    Time 8     Period Weeks    Status On-going      PT LONG TERM GOAL #4   Title Patient will demonstrate 65+ degrees of left shoulder ER AROM to improve donning/doffing apparel.    Time 8    Period Weeks    Status On-going      PT LONG TERM GOAL #5   Title Patient will report ability to perform home and work activities independently with left shoulder pain less than or equal to 3/10.    Time 8    Period Weeks    Status On-going                 Plan - 08/06/20 1623    Clinical Impression Statement Patient was able to tolerate treatment fairly but with more pain with additional stretching exercises. Patient required verbal and tactile cuing for form and technique with improved form. Patient required intermittent rest breaks secondary to pain and fatigue. Patient provided with HEP with stretching exercises to which she reported understanding. No adverse affects upon removal of modalities.    Personal Factors and Comorbidities Comorbidity 2    Comorbidities left proximal humerus ORIF 06/10/2020, sulfa drug allergy    Examination-Activity Limitations Reach Overhead;Dressing    Examination-Participation Restrictions Occupation;Driving    Stability/Clinical Decision Making Stable/Uncomplicated    Clinical Decision Making Low    Rehab Potential Good    PT Frequency 2x / week    PT Duration 8 weeks    PT Treatment/Interventions ADLs/Self Care Home Management;Moist Heat;Electrical Stimulation;Cryotherapy;Functional mobility training;Therapeutic activities;Therapeutic exercise;Neuromuscular re-education;Manual techniques;Passive range of motion;Patient/family education;Scar mobilization;Vasopneumatic Device;Taping    PT Next Visit Plan AAROM to left shoulder, PROM to improve ROM, modalities PRN for pain relief    PT Home Exercise Plan see patient education section    Consulted and Agree with Plan of Care Patient           Patient will benefit from skilled therapeutic intervention in order to  improve the following deficits and impairments:  Decreased range of motion,Decreased activity tolerance,Impaired UE functional use,Pain,Postural dysfunction,Decreased strength  Visit Diagnosis: Acute pain of left shoulder  Stiffness of left shoulder, not elsewhere classified  Localized edema  Muscle weakness (generalized)     Problem List Patient Active Problem List   Diagnosis Date Noted  . Adjustment disorder with mixed anxiety and depressed mood 05/06/2017  . Tobacco use disorder 02/22/2017  . History of hysterectomy 02/22/2017  . Allergic rhinitis due to allergen 02/22/2017  . Chronic fatigue 02/22/2017  . S/P hysterectomy 09/13/2013    [name], PT, DPT 08/06/2020, 4:38 PM  [name] [address] [place], [place], [address] Phone: [phone]   Fax:  [phone]  Name: Raynie Steinhaus. Roberge MRN: [id number] Date of Birth: 09/27/1960

## 2020-08-10 ENCOUNTER — Encounter: Payer: Self-pay | Admitting: Physical Therapy

## 2020-08-10 ENCOUNTER — Other Ambulatory Visit: Payer: Self-pay

## 2020-08-10 ENCOUNTER — Ambulatory Visit: Payer: BC Managed Care – PPO | Admitting: Physical Therapy

## 2020-08-10 DIAGNOSIS — M25512 Pain in left shoulder: Secondary | ICD-10-CM | POA: Diagnosis not present

## 2020-08-10 DIAGNOSIS — M6281 Muscle weakness (generalized): Secondary | ICD-10-CM

## 2020-08-10 DIAGNOSIS — M25612 Stiffness of left shoulder, not elsewhere classified: Secondary | ICD-10-CM | POA: Diagnosis not present

## 2020-08-10 DIAGNOSIS — R600 Localized edema: Secondary | ICD-10-CM

## 2020-08-10 NOTE — Therapy (Signed)
[name] [address] [place], [place], [address] Phone: [phone]   Fax:  [phone]  Physical Therapy Treatment  Patient Details  Name: Sara [name]. Hove MRN: [id number] Date of Birth: [birth year] Referring Provider (PT): Ramond Marrow, MD   Encounter Date: 08/10/2020   PT End of Session - 08/10/20 1302    Visit Number 14    Number of Visits 16    Date for PT Re-Evaluation 08/26/20    Authorization Type BCBS; FOTO; Progress note every 10th visit    PT Start Time 1030    PT Stop Time 1116    PT Time Calculation (min) 46 min    Activity Tolerance Patient tolerated treatment well;Patient limited by pain    Behavior During Therapy [facility] for tasks assessed/performed           Past Medical History:  Diagnosis Date  . Arthritis   . Proximal humerus fracture 06/03/2020   left    Past Surgical History:  Procedure Laterality Date  . ABDOMINAL HYSTERECTOMY  1986  . calcaneus fracture Left   . FRACTURE SURGERY  1979   fx collar bone  . ORIF HUMERUS FRACTURE Left 06/10/2020   Procedure: OPEN REDUCTION INTERNAL FIXATION (ORIF) PROXIMAL HUMERUS FRACTURE;  Surgeon: Bjorn Pippin, MD;  Location: [facility];  Service: Orthopedics;  Laterality: Left;    There were no vitals filed for this visit.   Subjective Assessment - 08/10/20 1042    Subjective COVID-19 screening performed upon arrival. Patient reports more pain in posterior shoulder and biceps today and over the weekend. Patient reports taking a muscle relaxer but stated more tension on R forearm muscles than L forearm muscle relaxation.    Pertinent History left proximal humerus ORIF 06/10/2020, sulfa drug allergy    Diagnostic tests x-ray    Patient Stated Goals use arm normally again    Currently in Pain? Yes    Pain Score 8     Pain Location Arm    Pain Orientation Left;Upper    Pain Descriptors / Indicators Sore    Pain Type Surgical pain    Pain Onset More than  a month ago    Pain Frequency Constant              [facility] PT Assessment - 08/10/20 0001      Assessment   Medical Diagnosis Left 3 part proximal humerus fracture ORIF, proximal humeral shaft fracture    Referring Provider (PT) Ramond Marrow, MD    Onset Date/Surgical Date 06/10/20    Hand Dominance Right    Next MD Visit 08/18/2020    Prior Therapy not for this      Precautions   Precautions Shoulder    Type of Shoulder Precautions follow DR. Varkey's RTC protocol                         [facility] Adult PT Treatment/Exercise - 08/10/20 0001      Exercises   Exercises Shoulder      Shoulder Exercises: Pulleys   Flexion 5 minutes      Shoulder Exercises: ROM/Strengthening   Ranger standing ranger flexion CW, CCW left shoulder x2 min each (6 mins total)      Modalities   Modalities Vasopneumatic      Vasopneumatic   Number Minutes Vasopneumatic  10 minutes    Vasopnuematic Location  Shoulder    Vasopneumatic Pressure Low    Vasopneumatic Temperature  34      Manual Therapy   Manual Therapy Passive ROM;Joint mobilization    Manual therapy comments STW/M UT and L posterior cuff    Joint Mobilization PA and inferior joint mobs to improve motion    Passive ROM PROM into ER abd and flexion 5-10 second holds to improve range with frequent oscillations to decrease guarding and pain                       PT Long Term Goals - 07/09/20 1123      PT LONG TERM GOAL #1   Title Patient will be independent with HEP    Time 8    Period Weeks    Status On-going      PT LONG TERM GOAL #2   Title Patient will demonstrate 4/5 or greater left shoulder MMT in all planes to improve strength and stability during functional tasks    Time 8    Period Weeks    Status On-going      PT LONG TERM GOAL #3   Title Patient will demonstrate 140+ degrees of left shoulder flexion AROM to improve ability to perform overhead tasks.    Time 8    Period Weeks    Status  On-going      PT LONG TERM GOAL #4   Title Patient will demonstrate 65+ degrees of left shoulder ER AROM to improve donning/doffing apparel.    Time 8    Period Weeks    Status On-going      PT LONG TERM GOAL #5   Title Patient will report ability to perform home and work activities independently with left shoulder pain less than or equal to 3/10.    Time 8    Period Weeks    Status On-going                 Plan - 08/10/20 1209    Clinical Impression Statement Patient arrives with concerns of ongoing pain in left shoulder. Patient guided through TEs but with pain. Pain reported with PROM; intermittent oscillations provided with some relief of pain. Patient with limited PA joint mobs with fair release. Patient and PT discussed pain levels as well as icing for edema control and pain control and heat as needed when she is at home. Patient reported understanding. No adverse affects upon removal of modalities.    Personal Factors and Comorbidities Comorbidity 2    Comorbidities left proximal humerus ORIF 06/10/2020, sulfa drug allergy    Examination-Activity Limitations Reach Overhead;Dressing    Examination-Participation Restrictions Occupation;Driving    Stability/Clinical Decision Making Stable/Uncomplicated    Clinical Decision Making Low    Rehab Potential Good    PT Frequency 2x / week    PT Duration 8 weeks    PT Treatment/Interventions ADLs/Self Care Home Management;Moist Heat;Electrical Stimulation;Cryotherapy;Functional mobility training;Therapeutic activities;Therapeutic exercise;Neuromuscular re-education;Manual techniques;Passive range of motion;Patient/family education;Scar mobilization;Vasopneumatic Device;Taping    PT Next Visit Plan AAROM to left shoulder, PROM to improve ROM, modalities PRN for pain relief    PT Home Exercise Plan see patient education section    Consulted and Agree with Plan of Care Patient           Patient will benefit from skilled  therapeutic intervention in order to improve the following deficits and impairments:  Decreased range of motion,Decreased activity tolerance,Impaired UE functional use,Pain,Postural dysfunction,Decreased strength  Visit Diagnosis: Acute pain of left shoulder  Stiffness of left shoulder, not elsewhere  classified  Localized edema  Muscle weakness (generalized)     Problem List Patient Active Problem List   Diagnosis Date Noted  . Adjustment disorder with mixed anxiety and depressed mood 05/06/2017  . Tobacco use disorder 02/22/2017  . History of hysterectomy 02/22/2017  . Allergic rhinitis due to allergen 02/22/2017  . Chronic fatigue 02/22/2017  . S/P hysterectomy 09/13/2013    [name], PT, DPT 08/10/2020, 1:02 PM  [facility] [address] [place], [place], [address] Phone: [phone]   Fax:  [phone]  Name: Otila Starn. Caroll MRN: [id number] Date of Birth: 07-25-1960

## 2020-08-11 ENCOUNTER — Encounter: Payer: Self-pay | Admitting: Physical Therapy

## 2020-08-11 ENCOUNTER — Ambulatory Visit: Payer: BC Managed Care – PPO | Admitting: Physical Therapy

## 2020-08-11 DIAGNOSIS — M6281 Muscle weakness (generalized): Secondary | ICD-10-CM

## 2020-08-11 DIAGNOSIS — R600 Localized edema: Secondary | ICD-10-CM

## 2020-08-11 DIAGNOSIS — M25612 Stiffness of left shoulder, not elsewhere classified: Secondary | ICD-10-CM

## 2020-08-11 DIAGNOSIS — M25512 Pain in left shoulder: Secondary | ICD-10-CM

## 2020-08-11 NOTE — Therapy (Signed)
[name] [address] [place], [place], [address] Phone: [phone]   Fax:  [phone]  Physical Therapy Treatment  Patient Details  Name: Sara [name]. Cartaya MRN: [id number] Date of Birth: 01-14-1961 Referring Provider (PT): Ramond Marrow, MD   Encounter Date: 08/11/2020   PT End of Session - 08/11/20 1228    Visit Number 15    Number of Visits 16    Date for PT Re-Evaluation 08/26/20    Authorization Type BCBS; FOTO; Progress note every 10th visit    PT Start Time 1115    PT Stop Time 1206    PT Time Calculation (min) 51 min    Activity Tolerance Patient tolerated treatment well;Patient limited by pain    Behavior During Therapy [facility] for tasks assessed/performed           Past Medical History:  Diagnosis Date  . Arthritis   . Proximal humerus fracture 06/03/2020   left    Past Surgical History:  Procedure Laterality Date  . ABDOMINAL HYSTERECTOMY  1986  . calcaneus fracture Left   . FRACTURE SURGERY  1979   fx collar bone  . ORIF HUMERUS FRACTURE Left 06/10/2020   Procedure: OPEN REDUCTION INTERNAL FIXATION (ORIF) PROXIMAL HUMERUS FRACTURE;  Surgeon: Bjorn Pippin, MD;  Location: [facility];  Service: Orthopedics;  Laterality: Left;    There were no vitals filed for this visit.   Subjective Assessment - 08/11/20 1226    Subjective COVID-19 screening performed upon arrival. Patient reports ongoing posterior shoulder pain.    Pertinent History left proximal humerus ORIF 06/10/2020, sulfa drug allergy    Limitations Lifting;House hold activities    Diagnostic tests x-ray    Patient Stated Goals use arm normally again    Currently in Pain? Yes   did not provide number on pain scale             [facility] PT Assessment - 08/11/20 0001      Assessment   Medical Diagnosis Left 3 part proximal humerus fracture ORIF, proximal humeral shaft fracture    Referring Provider (PT) Ramond Marrow, MD    Onset  Date/Surgical Date 06/10/20    Hand Dominance Right    Next MD Visit 08/18/2020    Prior Therapy not for this      Precautions   Precautions Shoulder    Type of Shoulder Precautions follow DR. Varkey's RTC protocol                         [facility] Adult PT Treatment/Exercise - 08/11/20 0001      Exercises   Exercises Shoulder      Shoulder Exercises: Pulleys   Flexion 5 minutes      Shoulder Exercises: ROM/Strengthening   Ranger standing ranger flexion CW, CCW left shoulder x3 min each (9 mins total)      Modalities   Modalities Vasopneumatic      Vasopneumatic   Number Minutes Vasopneumatic  10 minutes    Vasopnuematic Location  Shoulder    Vasopneumatic Pressure Low    Vasopneumatic Temperature  34      Manual Therapy   Manual Therapy Passive ROM    Manual therapy comments IASTM to left upper arm and posterior cuff and L UT    Passive ROM PROM into ER abd and flexion in sitting  PT Long Term Goals - 07/09/20 1123      PT LONG TERM GOAL #1   Title Patient will be independent with HEP    Time 8    Period Weeks    Status On-going      PT LONG TERM GOAL #2   Title Patient will demonstrate 4/5 or greater left shoulder MMT in all planes to improve strength and stability during functional tasks    Time 8    Period Weeks    Status On-going      PT LONG TERM GOAL #3   Title Patient will demonstrate 140+ degrees of left shoulder flexion AROM to improve ability to perform overhead tasks.    Time 8    Period Weeks    Status On-going      PT LONG TERM GOAL #4   Title Patient will demonstrate 65+ degrees of left shoulder ER AROM to improve donning/doffing apparel.    Time 8    Period Weeks    Status On-going      PT LONG TERM GOAL #5   Title Patient will report ability to perform home and work activities independently with left shoulder pain less than or equal to 3/10.    Time 8    Period Weeks    Status On-going                  Plan - 08/11/20 1228    Clinical Impression Statement Patient responded fairly well to therapy session with improvements with AAROM but still with pain with forward flexion. IASTM provided with reports of discomfort but good response to decrease scar tissue and decreased posterior cuff adhesions. Mild petechiae noted in L UT after IASTM; patient educated on possible bruising and petechiae to which she reported understanding. Normal response to vasopneumatic device upon removal.    Personal Factors and Comorbidities Comorbidity 2    Comorbidities left proximal humerus ORIF 06/10/2020, sulfa drug allergy    Examination-Activity Limitations Reach Overhead;Dressing    Examination-Participation Restrictions Occupation;Driving    Stability/Clinical Decision Making Stable/Uncomplicated    Clinical Decision Making Low    Rehab Potential Good    PT Frequency 2x / week    PT Duration 8 weeks    PT Treatment/Interventions ADLs/Self Care Home Management;Moist Heat;Electrical Stimulation;Cryotherapy;Functional mobility training;Therapeutic activities;Therapeutic exercise;Neuromuscular re-education;Manual techniques;Passive range of motion;Patient/family education;Scar mobilization;Vasopneumatic Device;Taping    PT Next Visit Plan AAROM to left shoulder, PROM to improve ROM, modalities PRN for pain relief    PT Home Exercise Plan see patient education section    Consulted and Agree with Plan of Care Patient           Patient will benefit from skilled therapeutic intervention in order to improve the following deficits and impairments:  Decreased range of motion,Decreased activity tolerance,Impaired UE functional use,Pain,Postural dysfunction,Decreased strength  Visit Diagnosis: Acute pain of left shoulder  Stiffness of left shoulder, not elsewhere classified  Localized edema  Muscle weakness (generalized)     Problem List Patient Active Problem List   Diagnosis Date Noted   . Adjustment disorder with mixed anxiety and depressed mood 05/06/2017  . Tobacco use disorder 02/22/2017  . History of hysterectomy 02/22/2017  . Allergic rhinitis due to allergen 02/22/2017  . Chronic fatigue 02/22/2017  . S/P hysterectomy 09/13/2013    [name], PT, DPT 08/11/2020, 12:36 PM  [name] [address] [place], [place], [address] Phone: [phone]   Fax:  [phone]  Name: Sache  Terrye Dombrosky MRN: [id number] Date of Birth: 1961-01-23

## 2020-08-17 ENCOUNTER — Other Ambulatory Visit: Payer: Self-pay

## 2020-08-17 ENCOUNTER — Encounter: Payer: Self-pay | Admitting: Physical Therapy

## 2020-08-17 ENCOUNTER — Ambulatory Visit: Payer: BC Managed Care – PPO | Attending: Orthopaedic Surgery | Admitting: Physical Therapy

## 2020-08-17 DIAGNOSIS — M25512 Pain in left shoulder: Secondary | ICD-10-CM | POA: Diagnosis not present

## 2020-08-17 DIAGNOSIS — M6281 Muscle weakness (generalized): Secondary | ICD-10-CM | POA: Diagnosis not present

## 2020-08-17 DIAGNOSIS — M25612 Stiffness of left shoulder, not elsewhere classified: Secondary | ICD-10-CM | POA: Insufficient documentation

## 2020-08-17 DIAGNOSIS — R600 Localized edema: Secondary | ICD-10-CM | POA: Insufficient documentation

## 2020-08-17 NOTE — Therapy (Signed)
[name] [address] [place], [place], [address] Phone: [phone]   Fax:  [phone]  Physical Therapy Treatment  Patient Details  Name: Sara [name] MRN: [id number] Date of Birth: [birth year] Referring Provider (PT): Ramond Marrow, MD   Encounter Date: 08/17/2020   PT End of Session - 08/17/20 0958    Visit Number 16    Number of Visits 24    Date for PT Re-Evaluation 10/02/20    Authorization Type BCBS; FOTO; Progress note every 10th visit    PT Start Time 0945    PT Stop Time 1037    PT Time Calculation (min) 52 min    Equipment Utilized During Treatment Other (comment)    Activity Tolerance Patient tolerated treatment well;Patient limited by pain    Behavior During Therapy [facility] for tasks assessed/performed           Past Medical History:  Diagnosis Date  . Arthritis   . Proximal humerus fracture 06/03/2020   left    Past Surgical History:  Procedure Laterality Date  . ABDOMINAL HYSTERECTOMY  1986  . calcaneus fracture Left   . FRACTURE SURGERY  1979   fx collar bone  . ORIF HUMERUS FRACTURE Left 06/10/2020   Procedure: OPEN REDUCTION INTERNAL FIXATION (ORIF) PROXIMAL HUMERUS FRACTURE;  Surgeon: Bjorn Pippin, MD;  Location: [facility];  Service: Orthopedics;  Laterality: Left;    There were no vitals filed for this visit.   Subjective Assessment - 08/17/20 0957    Subjective COVID-19 screening performed upon arrival. Patient reports doing fairly well all weekend until Sunday morning when the pain woke her up, 8/10. States IASTM helped.    Pertinent History left proximal humerus ORIF 06/10/2020, sulfa drug allergy    Limitations Lifting;House hold activities    Diagnostic tests x-ray    Patient Stated Goals use arm normally again    Currently in Pain? Yes    Pain Score 8     Pain Location Arm    Pain Orientation Left;Upper    Pain Descriptors / Indicators Shooting;Sharp    Pain Type Surgical  pain    Pain Onset More than a month ago    Pain Frequency Constant              [facility] PT Assessment - 08/17/20 0001      Assessment   Medical Diagnosis Left 3 part proximal humerus fracture ORIF, proximal humeral shaft fracture    Referring Provider (PT) Ramond Marrow, MD    Onset Date/Surgical Date 06/10/20    Hand Dominance Right    Next MD Visit 08/18/2020    Prior Therapy not for this      Precautions   Precautions Shoulder    Type of Shoulder Precautions follow DR. Varkey's RTC protocol      PROM   Left Shoulder Flexion 133 Degrees    Left Shoulder ABduction 109 Degrees    Left Shoulder External Rotation 47 Degrees                         [facility] Adult PT Treatment/Exercise - 08/17/20 0001      Exercises   Exercises Shoulder      Shoulder Exercises: Pulleys   Flexion 5 minutes      Shoulder Exercises: ROM/Strengthening   UBE (Upper Arm Bike) 120 RPM; 6 mins (fwd, bwd)    Ranger standing ranger flexion CW, CCW left shoulder x3 min each (  9 mins total)      Modalities   Modalities Vasopneumatic      Vasopneumatic   Number Minutes Vasopneumatic  10 minutes    Vasopnuematic Location  Shoulder    Vasopneumatic Pressure Low    Vasopneumatic Temperature  34      Manual Therapy   Manual Therapy Passive ROM    Manual therapy comments IASTM to left upper arm and posterior cuff and L UT    Passive ROM PROM into ER abd and flexion in sitting                       PT Long Term Goals - 08/17/20 1046      PT LONG TERM GOAL #1   Title Patient will be independent with HEP    Time 8    Period Weeks    Status Achieved      PT LONG TERM GOAL #2   Title Patient will demonstrate 4/5 or greater left shoulder MMT in all planes to improve strength and stability during functional tasks    Time 8    Period Weeks    Status On-going      PT LONG TERM GOAL #3   Title Patient will demonstrate 140+ degrees of left shoulder flexion AROM to improve  ability to perform overhead tasks.    Time 8    Period Weeks    Status On-going      PT LONG TERM GOAL #4   Title Patient will demonstrate 65+ degrees of left shoulder ER AROM to improve donning/doffing apparel.    Period Weeks    Status On-going      PT LONG TERM GOAL #5   Title Patient will report ability to perform home and work activities independently with left shoulder pain less than or equal to 3/10.    Period Weeks    Status On-going                 Plan - 08/17/20 1031    Clinical Impression Statement Patient arrived with ongoing L shoulder and arm pain particularly around the midshaft of the humerus. UBE initiated with good response minimal reports of increased pain. Patient noted with improved form with standing UE ranger after verbal cuing to keep the elbow tucked in during AAROM flexion. PROM improving but slow; see measurements. Patient and PT discussed adding an additional 8 visits to address ongoing goals and deficits but did explain PT visit limitaiton per insurance. Patient reported understanding. Patient with no adverse affects upon removal of modalities.    Personal Factors and Comorbidities Comorbidity 2    Comorbidities left proximal humerus ORIF 06/10/2020, sulfa drug allergy    Examination-Activity Limitations Reach Overhead;Dressing    Examination-Participation Restrictions Occupation;Driving    Stability/Clinical Decision Making Stable/Uncomplicated    Clinical Decision Making Low    Rehab Potential Good    PT Frequency 2x / week    PT Duration 8 weeks    PT Treatment/Interventions ADLs/Self Care Home Management;Moist Heat;Electrical Stimulation;Cryotherapy;Functional mobility training;Therapeutic activities;Therapeutic exercise;Neuromuscular re-education;Manual techniques;Passive range of motion;Patient/family education;Scar mobilization;Vasopneumatic Device;Taping    PT Next Visit Plan AAROM to left shoulder, PROM to improve ROM, modalities PRN for pain  relief    PT Home Exercise Plan see patient education section    Consulted and Agree with Plan of Care Patient           Patient will benefit from skilled therapeutic intervention in order to improve the following  deficits and impairments:  Decreased range of motion,Decreased activity tolerance,Impaired UE functional use,Pain,Postural dysfunction,Decreased strength  Visit Diagnosis: Acute pain of left shoulder - Plan: PT plan of care cert/re-cert  Stiffness of left shoulder, not elsewhere classified - Plan: PT plan of care cert/re-cert  Localized edema - Plan: PT plan of care cert/re-cert  Muscle weakness (generalized) - Plan: PT plan of care cert/re-cert     Problem List Patient Active Problem List   Diagnosis Date Noted  . Adjustment disorder with mixed anxiety and depressed mood 05/06/2017  . Tobacco use disorder 02/22/2017  . History of hysterectomy 02/22/2017  . Allergic rhinitis due to allergen 02/22/2017  . Chronic fatigue 02/22/2017  . S/P hysterectomy 09/13/2013    [name], PT, DPT 08/17/2020, 10:48 AM  [facility] [address] [place], [place], [address] Phone: [phone]   Fax:  [phone]  Name: Sara [name] MRN: [id number] Date of Birth: 03-13-1961

## 2020-08-18 DIAGNOSIS — S42295D Other nondisplaced fracture of upper end of left humerus, subsequent encounter for fracture with routine healing: Secondary | ICD-10-CM | POA: Diagnosis not present

## 2020-08-21 ENCOUNTER — Ambulatory Visit: Payer: BC Managed Care – PPO | Admitting: Physical Therapy

## 2020-08-21 ENCOUNTER — Other Ambulatory Visit: Payer: Self-pay

## 2020-08-21 DIAGNOSIS — M6281 Muscle weakness (generalized): Secondary | ICD-10-CM

## 2020-08-21 DIAGNOSIS — M25512 Pain in left shoulder: Secondary | ICD-10-CM

## 2020-08-21 DIAGNOSIS — R600 Localized edema: Secondary | ICD-10-CM | POA: Diagnosis not present

## 2020-08-21 DIAGNOSIS — M25612 Stiffness of left shoulder, not elsewhere classified: Secondary | ICD-10-CM | POA: Diagnosis not present

## 2020-08-21 NOTE — Therapy (Signed)
[name] [address] [place], [place], [address] Phone: [phone]   Fax:  [phone]  Physical Therapy Treatment  Patient Details  Name: Sara [name]. Golay MRN: [id number] Date of Birth: 1961/04/11 Referring Provider (PT): Ramond Marrow, MD   Encounter Date: 08/21/2020   PT End of Session - 08/21/20 1050    Visit Number 17    Number of Visits 24    Date for PT Re-Evaluation 10/02/20    Authorization Type BCBS; FOTO; Progress note every 10th visit    PT Start Time 0945    PT Stop Time 1036    PT Time Calculation (min) 51 min    Activity Tolerance Patient tolerated treatment well;Patient limited by pain    Behavior During Therapy [facility] for tasks assessed/performed           Past Medical History:  Diagnosis Date  . Arthritis   . Proximal humerus fracture 06/03/2020   left    Past Surgical History:  Procedure Laterality Date  . ABDOMINAL HYSTERECTOMY  1986  . calcaneus fracture Left   . FRACTURE SURGERY  1979   fx collar bone  . ORIF HUMERUS FRACTURE Left 06/10/2020   Procedure: OPEN REDUCTION INTERNAL FIXATION (ORIF) PROXIMAL HUMERUS FRACTURE;  Surgeon: Bjorn Pippin, MD;  [facility];  Service: Orthopedics;  Laterality: Left;    There were no vitals filed for this visit.   Subjective Assessment - 08/21/20 1042    Subjective COVID-19 screening performed upon arrival. Patient reports some pain but MD follow up went fairly well and has lifted restrictions with allowing pain be as her guide.    Pertinent History left proximal humerus ORIF 06/10/2020, sulfa drug allergy    Limitations Lifting;House hold activities    Diagnostic tests x-ray    Patient Stated Goals use arm normally again    Currently in Pain? Yes   did no provide number on pain scale             [facility] PT Assessment - 08/21/20 0001      Assessment   Medical Diagnosis Left 3 part proximal humerus fracture ORIF, proximal humeral shaft  fracture    Referring Provider (PT) Ramond Marrow, MD    Onset Date/Surgical Date 06/10/20    Hand Dominance Right    Next MD Visit 08/18/2020    Prior Therapy not for this      Precautions   Precautions Shoulder    Type of Shoulder Precautions no restrictions per MD follow up                         [facility] Adult PT Treatment/Exercise - 08/21/20 0001      Exercises   Exercises Shoulder      Shoulder Exercises: Pulleys   Flexion 5 minutes      Shoulder Exercises: ROM/Strengthening   UBE (Upper Arm Bike) 120 RPM; 8 mins (fwd, bwd)    Other ROM/Strengthening Exercises bilateral scapular wall slides x2 mins      Shoulder Exercises: Stretch   External Rotation Stretch 3 reps;30 seconds   doorway stretch     Modalities   Modalities Vasopneumatic      Vasopneumatic   Number Minutes Vasopneumatic  10 minutes    Vasopnuematic Location  Shoulder    Vasopneumatic Pressure Low    Vasopneumatic Temperature  34      Manual Therapy   Manual Therapy Passive ROM    Manual  therapy comments IASTM to left upper arm and posterior cuff and L UT    Passive ROM PROM into ER horizontal abd and flexion with holds in supine                       PT Long Term Goals - 08/17/20 1046      PT LONG TERM GOAL #1   Title Patient will be independent with HEP    Time 8    Period Weeks    Status Achieved      PT LONG TERM GOAL #2   Title Patient will demonstrate 4/5 or greater left shoulder MMT in all planes to improve strength and stability during functional tasks    Time 8    Period Weeks    Status On-going      PT LONG TERM GOAL #3   Title Patient will demonstrate 140+ degrees of left shoulder flexion AROM to improve ability to perform overhead tasks.    Time 8    Period Weeks    Status On-going      PT LONG TERM GOAL #4   Title Patient will demonstrate 65+ degrees of left shoulder ER AROM to improve donning/doffing apparel.    Period Weeks    Status On-going       PT LONG TERM GOAL #5   Title Patient will report ability to perform home and work activities independently with left shoulder pain less than or equal to 3/10.    Period Weeks    Status On-going                 Plan - 08/21/20 1051    Clinical Impression Statement Patient arrived doing fairly well with reports of left humerus being fully healed per x-ray. Patient guided through TEs but with reports of pain. Patient was able to tolerate IASTM to scar tissue fairly well. Patient with improved tolerance to PROM especially with end range overpressure. Patient discussed performing HEP allowing pain being her guide. Patient reported understanding.    Personal Factors and Comorbidities Comorbidity 2    Comorbidities left proximal humerus ORIF 06/10/2020, sulfa drug allergy    Examination-Activity Limitations Reach Overhead;Dressing    Examination-Participation Restrictions Occupation;Driving    Stability/Clinical Decision Making Stable/Uncomplicated    Clinical Decision Making Low    Rehab Potential Good    PT Frequency 2x / week    PT Duration 8 weeks    PT Treatment/Interventions ADLs/Self Care Home Management;Moist Heat;Electrical Stimulation;Cryotherapy;Functional mobility training;Therapeutic activities;Therapeutic exercise;Neuromuscular re-education;Manual techniques;Passive range of motion;Patient/family education;Scar mobilization;Vasopneumatic Device;Taping    PT Next Visit Plan AAROM to left shoulder, PROM to improve ROM, modalities PRN for pain relief    PT Home Exercise Plan see patient education section    Consulted and Agree with Plan of Care Patient           Patient will benefit from skilled therapeutic intervention in order to improve the following deficits and impairments:  Decreased range of motion,Decreased activity tolerance,Impaired UE functional use,Pain,Postural dysfunction,Decreased strength  Visit Diagnosis: Acute pain of left shoulder  Stiffness of left  shoulder, not elsewhere classified  Muscle weakness (generalized)     Problem List Patient Active Problem List   Diagnosis Date Noted  . Adjustment disorder with mixed anxiety and depressed mood 05/06/2017  . Tobacco use disorder 02/22/2017  . History of hysterectomy 02/22/2017  . Allergic rhinitis due to allergen 02/22/2017  . Chronic fatigue 02/22/2017  . S/P hysterectomy 09/13/2013  [name], PT, DPT 08/21/2020, 12:27 PM  [name] [address] [place], [place], [address] Phone: [phone]   Fax:  [phone]  Name: Recie Cirrincione. Tenpas MRN: [id number] Date of Birth: 11/19/1960

## 2020-08-24 ENCOUNTER — Telehealth: Payer: Self-pay | Admitting: Physical Therapy

## 2020-08-24 NOTE — Telephone Encounter (Signed)
L/m to cx 4/14 no reason given. Called her back to r/s l/m

## 2020-08-26 ENCOUNTER — Ambulatory Visit: Payer: BC Managed Care – PPO | Admitting: Physical Therapy

## 2020-08-26 ENCOUNTER — Other Ambulatory Visit: Payer: Self-pay

## 2020-08-26 DIAGNOSIS — M6281 Muscle weakness (generalized): Secondary | ICD-10-CM

## 2020-08-26 DIAGNOSIS — M25612 Stiffness of left shoulder, not elsewhere classified: Secondary | ICD-10-CM

## 2020-08-26 DIAGNOSIS — R600 Localized edema: Secondary | ICD-10-CM

## 2020-08-26 DIAGNOSIS — M25512 Pain in left shoulder: Secondary | ICD-10-CM

## 2020-08-26 NOTE — Therapy (Signed)
[name] [address] [place], [place], [address] Phone: [phone]   Fax:  [phone]  Physical Therapy Treatment  Patient Details  Name: Sara [name]. Bohlen MRN: [id number] Date of Birth: 1960-09-16 Referring Provider (PT): Ramond Marrow, MD   Encounter Date: 08/26/2020   PT End of Session - 08/26/20 1207    Visit Number 18    Number of Visits 24    Date for PT Re-Evaluation 10/02/20    Authorization Type BCBS; FOTO; Progress note every 10th visit    PT Start Time 1115    PT Stop Time 1209    PT Time Calculation (min) 54 min    Activity Tolerance Patient tolerated treatment well;Patient limited by pain    Behavior During Therapy [facility] for tasks assessed/performed           Past Medical History:  Diagnosis Date  . Arthritis   . Proximal humerus fracture 06/03/2020   left    Past Surgical History:  Procedure Laterality Date  . ABDOMINAL HYSTERECTOMY  1986  . calcaneus fracture Left   . FRACTURE SURGERY  1979   fx collar bone  . ORIF HUMERUS FRACTURE Left 06/10/2020   Procedure: OPEN REDUCTION INTERNAL FIXATION (ORIF) PROXIMAL HUMERUS FRACTURE;  Surgeon: Bjorn Pippin, MD;  Location: [facility];  Service: Orthopedics;  Laterality: Left;    There were no vitals filed for this visit.   Subjective Assessment - 08/26/20 1208    Subjective COVID-19 screen performed prior to patient entering clinic.   Hurting a lot since last treatment.  Having trouble sleeping.    Pertinent History left proximal humerus ORIF 06/10/2020, sulfa drug allergy    Limitations Lifting;House hold activities    Patient Stated Goals use arm normally again    Currently in Pain? Yes    Pain Score 8     Pain Location Arm    Pain Orientation Left;Upper    Pain Descriptors / Indicators Shooting;Sharp    Pain Onset More than a month ago                             [facility] Adult PT Treatment/Exercise - 08/26/20 0001       Exercises   Exercises Shoulder      Shoulder Exercises: ROM/Strengthening   UBE (Upper Arm Bike) 120 RPM's x 6 minutes (3 mins forward and 3 mins backward).      Modalities   Modalities Moist Heat;Electrical Stimulation      Moist Heat Therapy   Number Minutes Moist Heat 20 Minutes    Moist Heat Location --   Left shoulder.     Programme researcher, broadcasting/film/video Location Left shoulder.    Electrical Stimulation Action IFC at 80-150 Hz    Electrical Stimulation Parameters 40% scan x 20 minutes.      Manual Therapy   Manual Therapy Soft tissue mobilization    Soft tissue mobilization STW/M x 17 minutes to patient's left shoulder/incisional site region.                       PT Long Term Goals - 08/17/20 1046      PT LONG TERM GOAL #1   Title Patient will be independent with HEP    Time 8    Period Weeks    Status Achieved      PT LONG TERM GOAL #2   Title  Patient will demonstrate 4/5 or greater left shoulder MMT in all planes to improve strength and stability during functional tasks    Time 8    Period Weeks    Status On-going      PT LONG TERM GOAL #3   Title Patient will demonstrate 140+ degrees of left shoulder flexion AROM to improve ability to perform overhead tasks.    Time 8    Period Weeks    Status On-going      PT LONG TERM GOAL #4   Title Patient will demonstrate 65+ degrees of left shoulder ER AROM to improve donning/doffing apparel.    Period Weeks    Status On-going      PT LONG TERM GOAL #5   Title Patient will report ability to perform home and work activities independently with left shoulder pain less than or equal to 3/10.    Period Weeks    Status On-going                 Plan - 08/26/20 1219    Clinical Impression Statement The patient reporting increased pain and trouble sleeping.  She responded very well to STW/M over her left upper arm/incisional site region.  She felt much better after treatment.     Personal Factors and Comorbidities Comorbidity 2    Comorbidities left proximal humerus ORIF 06/10/2020, sulfa drug allergy    Examination-Activity Limitations Reach Overhead;Dressing    Examination-Participation Restrictions Occupation;Driving    Rehab Potential Good    PT Frequency 2x / week    PT Duration 8 weeks    PT Treatment/Interventions ADLs/Self Care Home Management;Moist Heat;Electrical Stimulation;Cryotherapy;Functional mobility training;Therapeutic activities;Therapeutic exercise;Neuromuscular re-education;Manual techniques;Passive range of motion;Patient/family education;Scar mobilization;Vasopneumatic Device;Taping    PT Next Visit Plan AAROM to left shoulder, PROM to improve ROM, modalities PRN for pain relief           Patient will benefit from skilled therapeutic intervention in order to improve the following deficits and impairments:  Decreased range of motion,Decreased activity tolerance,Impaired UE functional use,Pain,Postural dysfunction,Decreased strength  Visit Diagnosis: Acute pain of left shoulder  Stiffness of left shoulder, not elsewhere classified  Muscle weakness (generalized)  Localized edema     Problem List Patient Active Problem List   Diagnosis Date Noted  . Adjustment disorder with mixed anxiety and depressed mood 05/06/2017  . Tobacco use disorder 02/22/2017  . History of hysterectomy 02/22/2017  . Allergic rhinitis due to allergen 02/22/2017  . Chronic fatigue 02/22/2017  . S/P hysterectomy 09/13/2013    Sara [name], [place] MPT 08/26/2020, 12:22 PM  [facility] [address] [place], [place], [address] Phone: [phone]   Fax:  [phone]  Name: Sara [name]. Hulgan MRN: [id number] Date of Birth: 12-04-1960

## 2020-08-27 ENCOUNTER — Ambulatory Visit: Payer: BC Managed Care – PPO | Admitting: Physical Therapy

## 2020-08-31 ENCOUNTER — Ambulatory Visit: Payer: BC Managed Care – PPO | Admitting: Physical Therapy

## 2020-08-31 ENCOUNTER — Other Ambulatory Visit: Payer: Self-pay

## 2020-08-31 DIAGNOSIS — R600 Localized edema: Secondary | ICD-10-CM

## 2020-08-31 DIAGNOSIS — M6281 Muscle weakness (generalized): Secondary | ICD-10-CM

## 2020-08-31 DIAGNOSIS — M25512 Pain in left shoulder: Secondary | ICD-10-CM | POA: Diagnosis not present

## 2020-08-31 DIAGNOSIS — M25612 Stiffness of left shoulder, not elsewhere classified: Secondary | ICD-10-CM | POA: Diagnosis not present

## 2020-08-31 NOTE — Therapy (Signed)
[name] [address] [place], [place], [address] Phone: [phone]   Fax:  [phone]  Physical Therapy Treatment  Patient Details  Name: Sara [name]. Broadnax MRN: [id number] Date of Birth: 01/11/1961 Referring Provider (PT): Ramond Marrow, MD   Encounter Date: 08/31/2020   PT End of Session - 08/31/20 1159    Visit Number 19    Number of Visits 24    Date for PT Re-Evaluation 10/02/20    PT Start Time 1115    PT Stop Time 1206    PT Time Calculation (min) 51 min    Activity Tolerance Patient tolerated treatment well;Patient limited by pain           Past Medical History:  Diagnosis Date  . Arthritis   . Proximal humerus fracture 06/03/2020   left    Past Surgical History:  Procedure Laterality Date  . ABDOMINAL HYSTERECTOMY  1986  . calcaneus fracture Left   . FRACTURE SURGERY  1979   fx collar bone  . ORIF HUMERUS FRACTURE Left 06/10/2020   Procedure: OPEN REDUCTION INTERNAL FIXATION (ORIF) PROXIMAL HUMERUS FRACTURE;  Surgeon: Bjorn Pippin, MD;  Location: [facility];  Service: Orthopedics;  Laterality: Left;    There were no vitals filed for this visit.   Subjective Assessment - 08/31/20 1159    Subjective COVID-19 screen performed prior to patient entering clinic.  Last treatment helped.    Pertinent History left proximal humerus ORIF 06/10/2020, sulfa drug allergy    Limitations Lifting;House hold activities    Patient Stated Goals use arm normally again    Currently in Pain? Yes    Pain Score 7     Pain Location Arm    Pain Orientation Left;Upper    Pain Descriptors / Indicators Shooting    Pain Onset More than a month ago                             [facility] Adult PT Treatment/Exercise - 08/31/20 0001      Shoulder Exercises: ROM/Strengthening   UBE (Upper Arm Bike) 120 RPM's x 8 minutes      Modalities   Modalities Moist Heat;Electrical Stimulation      Moist Heat Therapy    Number Minutes Moist Heat 20 Minutes    Moist Heat Location --   LT SHLD.     Emergency planning/management officer LT SHLD.    Electrical Stimulation Action IFC to 80-150 Hz.    Electrical Stimulation Parameters 40% scan x 20 minutes.    Electrical Stimulation Goals Pain      Manual Therapy   Manual Therapy Passive ROM    Passive ROM In supine:  PROM x 15 minutes to patient's left shoulder with low load long duration stretching technique utilized x 15 minutes into flexion and extension.                       PT Long Term Goals - 08/17/20 1046      PT LONG TERM GOAL #1   Title Patient will be independent with HEP    Time 8    Period Weeks    Status Achieved      PT LONG TERM GOAL #2   Title Patient will demonstrate 4/5 or greater left shoulder MMT in all planes to improve strength and stability during functional tasks    Time 8  Period Weeks    Status On-going      PT LONG TERM GOAL #3   Title Patient will demonstrate 140+ degrees of left shoulder flexion AROM to improve ability to perform overhead tasks.    Time 8    Period Weeks    Status On-going      PT LONG TERM GOAL #4   Title Patient will demonstrate 65+ degrees of left shoulder ER AROM to improve donning/doffing apparel.    Period Weeks    Status On-going      PT LONG TERM GOAL #5   Title Patient will report ability to perform home and work activities independently with left shoulder pain less than or equal to 3/10.    Period Weeks    Status On-going                 Plan - 08/31/20 1203    Clinical Impression Statement Patient did well today.  Her left shoulder remains stiff and needs continued stretching.  She did well with treatment today.    Personal Factors and Comorbidities Comorbidity 2    Comorbidities left proximal humerus ORIF 06/10/2020, sulfa drug allergy    Examination-Activity Limitations Reach Overhead;Dressing    Examination-Participation  Restrictions Occupation;Driving    Stability/Clinical Decision Making Stable/Uncomplicated    Rehab Potential Good    PT Frequency 2x / week    PT Treatment/Interventions ADLs/Self Care Home Management;Moist Heat;Electrical Stimulation;Cryotherapy;Functional mobility training;Therapeutic activities;Therapeutic exercise;Neuromuscular re-education;Manual techniques;Passive range of motion;Patient/family education;Scar mobilization;Vasopneumatic Device;Taping    PT Next Visit Plan AAROM to left shoulder, PROM to improve ROM, modalities PRN for pain relief    Consulted and Agree with Plan of Care Patient           Patient will benefit from skilled therapeutic intervention in order to improve the following deficits and impairments:  Decreased range of motion,Decreased activity tolerance,Impaired UE functional use,Pain,Postural dysfunction,Decreased strength  Visit Diagnosis: Acute pain of left shoulder  Stiffness of left shoulder, not elsewhere classified  Muscle weakness (generalized)  Localized edema     Problem List Patient Active Problem List   Diagnosis Date Noted  . Adjustment disorder with mixed anxiety and depressed mood 05/06/2017  . Tobacco use disorder 02/22/2017  . History of hysterectomy 02/22/2017  . Allergic rhinitis due to allergen 02/22/2017  . Chronic fatigue 02/22/2017  . S/P hysterectomy 09/13/2013    Chiyo Fay, [place] MPT 08/31/2020, 12:09 PM  [facility] [address] [place], [place], [address] Phone: [phone]   Fax:  [phone]  Name: Mozella Rexrode. Fitzsimmons MRN: [id number] Date of Birth: Apr 21, 1961

## 2020-09-03 ENCOUNTER — Other Ambulatory Visit: Payer: Self-pay

## 2020-09-03 ENCOUNTER — Ambulatory Visit: Payer: BC Managed Care – PPO | Admitting: Physical Therapy

## 2020-09-03 DIAGNOSIS — M6281 Muscle weakness (generalized): Secondary | ICD-10-CM

## 2020-09-03 DIAGNOSIS — M25512 Pain in left shoulder: Secondary | ICD-10-CM | POA: Diagnosis not present

## 2020-09-03 DIAGNOSIS — M25612 Stiffness of left shoulder, not elsewhere classified: Secondary | ICD-10-CM

## 2020-09-03 DIAGNOSIS — R600 Localized edema: Secondary | ICD-10-CM

## 2020-09-03 NOTE — Therapy (Signed)
[name] [address] [place], [place], [address] Phone: [phone]   Fax:  [phone]  Physical Therapy Treatment  Patient Details  Name: Roseanne Juenger. Carolan MRN: [id number] Date of Birth: 03/07/1961 Referring Provider (PT): Ramond Marrow, MD   Encounter Date: 09/03/2020   PT End of Session - 09/03/20 1150    Visit Number 20    Number of Visits 24    Date for PT Re-Evaluation 10/02/20    Authorization Type BCBS; FOTO; Progress note every 10th visit    PT Start Time 1115    PT Stop Time 1204    PT Time Calculation (min) 49 min    Activity Tolerance Patient tolerated treatment well    Behavior During Therapy [facility] for tasks assessed/performed           Past Medical History:  Diagnosis Date  . Arthritis   . Proximal humerus fracture 06/03/2020   left    Past Surgical History:  Procedure Laterality Date  . ABDOMINAL HYSTERECTOMY  1986  . calcaneus fracture Left   . FRACTURE SURGERY  1979   fx collar bone  . ORIF HUMERUS FRACTURE Left 06/10/2020   Procedure: OPEN REDUCTION INTERNAL FIXATION (ORIF) PROXIMAL HUMERUS FRACTURE;  Surgeon: Bjorn Pippin, MD;  Location: [facility];  Service: Orthopedics;  Laterality: Left;    There were no vitals filed for this visit.   Subjective Assessment - 09/03/20 1149    Subjective COVID-19 screen performed prior to patient entering clinic.  Pain wakes me at night.  patient requesting another session of soft tissue work as she found it helpful.    Pertinent History left proximal humerus ORIF 06/10/2020, sulfa drug allergy    Limitations Lifting;House hold activities    Patient Stated Goals use arm normally again    Currently in Pain? Yes    Pain Score 7     Pain Location Shoulder    Pain Orientation Left    Pain Descriptors / Indicators Shooting                             [facility] Adult PT Treatment/Exercise - 09/03/20 0001      Exercises   Exercises  Shoulder      Shoulder Exercises: ROM/Strengthening   UBE (Upper Arm Bike) 120 RPm's x 8 minutes.      Modalities   Modalities Electrical Stimulation;Moist Heat      Moist Heat Therapy   Number Minutes Moist Heat 15 Minutes    Moist Heat Location --   Left shoulder.     Emergency planning/management officer Lt SHLD.    Electrical Stimulation Action IFC at 80-150 Hz.    Electrical Stimulation Parameters 40% scan x 15 minutes.    Electrical Stimulation Goals Pain      Manual Therapy   Manual Therapy Soft tissue mobilization;Passive ROM    Soft tissue mobilization STW/M x 13 minutes to patient's affected left shoulder/upper arm musculature.    Passive ROM 2 minutes sustained left shoulder ER stretch                       PT Long Term Goals - 09/03/20 1151      PT LONG TERM GOAL #1   Period Weeks    Status On-going      PT LONG TERM GOAL #2   Title Patient will demonstrate 4/5 or greater  left shoulder MMT in all planes to improve strength and stability during functional tasks    Time 8    Period Weeks    Status On-going      PT LONG TERM GOAL #3   Title Patient will demonstrate 140+ degrees of left shoulder flexion AROM to improve ability to perform overhead tasks.    Time 8    Period Weeks    Status On-going      PT LONG TERM GOAL #4   Title Patient will demonstrate 65+ degrees of left shoulder ER AROM to improve donning/doffing apparel.    Time 8    Period Weeks    Status On-going      PT LONG TERM GOAL #5   Title Patient will report ability to perform home and work activities independently with left shoulder pain less than or equal to 3/10.    Time 8    Period Weeks    Status On-going                 Plan - 09/03/20 1157    Clinical Impression Statement Patient with continued pain that wakes her at night as well.  She felt better after STW/M.  Reviewed countertop stretch to increase shoulder flexion.    Personal Factors  and Comorbidities Comorbidity 2    Comorbidities left proximal humerus ORIF 06/10/2020, sulfa drug allergy    Examination-Activity Limitations Reach Overhead;Dressing    Examination-Participation Restrictions Occupation;Driving    Rehab Potential Good    PT Frequency 2x / week    PT Duration 8 weeks    PT Treatment/Interventions ADLs/Self Care Home Management;Moist Heat;Electrical Stimulation;Cryotherapy;Functional mobility training;Therapeutic activities;Therapeutic exercise;Neuromuscular re-education;Manual techniques;Passive range of motion;Patient/family education;Scar mobilization;Vasopneumatic Device;Taping    PT Next Visit Plan AAROM to left shoulder, PROM to improve ROM, modalities PRN for pain relief    Consulted and Agree with Plan of Care Patient           Patient will benefit from skilled therapeutic intervention in order to improve the following deficits and impairments:  Decreased range of motion,Decreased activity tolerance,Impaired UE functional use,Pain,Postural dysfunction,Decreased strength  Visit Diagnosis: Acute pain of left shoulder  Stiffness of left shoulder, not elsewhere classified  Muscle weakness (generalized)  Localized edema     Problem List Patient Active Problem List   Diagnosis Date Noted  . Adjustment disorder with mixed anxiety and depressed mood 05/06/2017  . Tobacco use disorder 02/22/2017  . History of hysterectomy 02/22/2017  . Allergic rhinitis due to allergen 02/22/2017  . Chronic fatigue 02/22/2017  . S/P hysterectomy 09/13/2013    Progress Note Reporting Period 06/24/20 to 09/03/20.  See note below for Objective Data and Assessment of Progress/Goals. Patient goals are ongoing at this time.  She has continued left shoulder pain and loss of motion.      Jakala Herford, [place] MPT 09/03/2020, 12:12 PM  [facility] [address] [place], [place], [address] Phone: [phone]   Fax:   [phone]  Name: Doneisha Ivey. Hinesley MRN: [id number] Date of Birth: [birth year]

## 2020-09-07 ENCOUNTER — Ambulatory Visit: Payer: BC Managed Care – PPO | Admitting: Physical Therapy

## 2020-09-09 ENCOUNTER — Ambulatory Visit: Payer: BC Managed Care – PPO | Admitting: Dermatology

## 2020-09-10 ENCOUNTER — Ambulatory Visit: Payer: BC Managed Care – PPO | Admitting: Physical Therapy

## 2020-09-10 ENCOUNTER — Other Ambulatory Visit: Payer: Self-pay

## 2020-09-10 DIAGNOSIS — M25512 Pain in left shoulder: Secondary | ICD-10-CM | POA: Diagnosis not present

## 2020-09-10 DIAGNOSIS — M25612 Stiffness of left shoulder, not elsewhere classified: Secondary | ICD-10-CM

## 2020-09-10 DIAGNOSIS — R600 Localized edema: Secondary | ICD-10-CM | POA: Diagnosis not present

## 2020-09-10 DIAGNOSIS — M6281 Muscle weakness (generalized): Secondary | ICD-10-CM | POA: Diagnosis not present

## 2020-09-10 NOTE — Therapy (Addendum)
[name] [place], [place], [address] Phone: [phone]   Fax:  [phone]  Physical Therapy Treatment  Patient Details  Name: Sara [name] MRN: [id number] Date of Birth: 09/14/1960 Referring Provider (PT): Ophelia Charter, MD   Encounter Date: 09/10/2020   PT End of Session - 09/10/20 1556     Visit Number 21    Number of Visits 24    Date for PT Re-Evaluation 10/02/20    Authorization Type BCBS; FOTO; Progress note every 10th visit    PT Start Time 0145    PT Stop Time 0240    PT Time Calculation (min) 55 min    Behavior During Therapy [facility] for tasks assessed/performed             Past Medical History:  Diagnosis Date   Arthritis    Proximal humerus fracture 06/03/2020   left    Past Surgical History:  Procedure Laterality Date   ABDOMINAL HYSTERECTOMY  1986   calcaneus fracture Left    FRACTURE SURGERY  1979   fx collar bone   ORIF HUMERUS FRACTURE Left 06/10/2020   Procedure: OPEN REDUCTION INTERNAL FIXATION (ORIF) PROXIMAL HUMERUS FRACTURE;  Surgeon: Hiram Gash, MD;  Location: [facility];  Service: Orthopedics;  Laterality: Left;    There were no vitals filed for this visit.   Subjective Assessment - 09/10/20 1529     Subjective COVID-19 screen performed prior to patient entering clinic.  Doing okay.    Pertinent History left proximal humerus ORIF 06/10/2020, sulfa drug allergy    Limitations Lifting;House hold activities    Patient Stated Goals use arm normally again    Currently in Pain? Yes    Pain Score 7     Pain Location Shoulder    Pain Orientation Left    Pain Descriptors / Indicators Shooting    Pain Type Surgical pain    Pain Onset More than a month ago                               [facility] Adult PT Treatment/Exercise - 09/10/20 0001       Shoulder Exercises: Supine   Other Supine Exercises Left shoulder anterior capsular stretch off table x 7  minutes.      Shoulder Exercises: Pulleys   Flexion 5 minutes    Other Pulley Exercises Wall ladder x 3 minutes.      Shoulder Exercises: ROM/Strengthening   UBE (Upper Arm Bike) 120 RPM's x 8 minutes.      Modalities   Modalities Electrical Stimulation;Moist Heat      Moist Heat Therapy   Number Minutes Moist Heat 20 Minutes    Moist Heat Location --   LT shoulder.     Acupuncturist Location Left shoulder.    Electrical Stimulation Action IFC at 80-150 Hz x 20 minutes.    Electrical Stimulation Goals Pain                         PT Long Term Goals - 09/03/20 1151       PT LONG TERM GOAL #1   Period Weeks    Status On-going      PT LONG TERM GOAL #2   Title Patient will demonstrate 4/5 or greater left shoulder MMT in all planes to improve strength and stability during functional tasks  Time 8    Period Weeks    Status On-going      PT LONG TERM GOAL #3   Title Patient will demonstrate 140+ degrees of left shoulder flexion AROM to improve ability to perform overhead tasks.    Time 8    Period Weeks    Status On-going      PT LONG TERM GOAL #4   Title Patient will demonstrate 65+ degrees of left shoulder ER AROM to improve donning/doffing apparel.    Time 8    Period Weeks    Status On-going      PT LONG TERM GOAL #5   Title Patient will report ability to perform home and work activities independently with left shoulder pain less than or equal to 3/10.    Time 8    Period Weeks    Status On-going                   Plan - 09/10/20 1552     Clinical Impression Statement Patient did very well today.  Added doorway stretch, hands clasp behind head for ER, anterior capsular stretch off table and low load ER stretch in supine with 1#.    Personal Factors and Comorbidities Comorbidity 2    Comorbidities left proximal humerus ORIF 06/10/2020, sulfa drug allergy    Examination-Activity Limitations Reach  Overhead;Dressing    Examination-Participation Restrictions Occupation;Driving    Stability/Clinical Decision Making Stable/Uncomplicated    Clinical Decision Making Low    Rehab Potential Good    PT Frequency 2x / week    PT Duration 8 weeks    PT Treatment/Interventions ADLs/Self Care Home Management;Moist Heat;Electrical Stimulation;Cryotherapy;Functional mobility training;Therapeutic activities;Therapeutic exercise;Neuromuscular re-education;Manual techniques;Passive range of motion;Patient/family education;Scar mobilization;Vasopneumatic Device;Taping    PT Next Visit Plan AAROM to left shoulder, PROM to improve ROM, modalities PRN for pain relief    Consulted and Agree with Plan of Care Patient             Patient will benefit from skilled therapeutic intervention in order to improve the following deficits and impairments:  Decreased range of motion,Decreased activity tolerance,Impaired UE functional use,Pain,Postural dysfunction,Decreased strength  Visit Diagnosis: Acute pain of left shoulder  Stiffness of left shoulder, not elsewhere classified  Localized edema     Problem List Patient Active Problem List   Diagnosis Date Noted   Adjustment disorder with mixed anxiety and depressed mood 05/06/2017   Tobacco use disorder 02/22/2017   History of hysterectomy 02/22/2017   Allergic rhinitis due to allergen 02/22/2017   Chronic fatigue 02/22/2017   S/P hysterectomy 09/13/2013   PHYSICAL THERAPY DISCHARGE SUMMARY  Visits from Start of Care: 21.  Current functional level related to goals / functional outcomes: See above.   Remaining deficits: Goals unmet.   Education / Equipment: HEP.   Patient agrees to discharge. Patient goals were not met. Patient is being discharged due to not returning since the last visit.  Sara [name], [place] MPT 09/10/2020, 3:57 PM  [facility] [address] [place], [place], [address] Phone:  [phone]   Fax:  [phone]  Name: Sara [name] MRN: [id number] Date of Birth: Sep 15, 1960

## 2020-09-17 ENCOUNTER — Ambulatory Visit: Payer: BC Managed Care – PPO | Admitting: Physical Therapy

## 2020-09-24 ENCOUNTER — Ambulatory Visit (INDEPENDENT_AMBULATORY_CARE_PROVIDER_SITE_OTHER): Payer: BC Managed Care – PPO | Admitting: Family Medicine

## 2020-09-24 ENCOUNTER — Other Ambulatory Visit: Payer: Self-pay

## 2020-09-24 ENCOUNTER — Encounter: Payer: Self-pay | Admitting: Family Medicine

## 2020-09-24 VITALS — BP 96/60 | HR 77 | Temp 97.9°F | Ht 63.0 in | Wt 132.0 lb

## 2020-09-24 DIAGNOSIS — Z5320 Procedure and treatment not carried out because of patient's decision for unspecified reasons: Secondary | ICD-10-CM

## 2020-09-24 DIAGNOSIS — F17200 Nicotine dependence, unspecified, uncomplicated: Secondary | ICD-10-CM | POA: Diagnosis not present

## 2020-09-24 DIAGNOSIS — H409 Unspecified glaucoma: Secondary | ICD-10-CM | POA: Insufficient documentation

## 2020-09-24 DIAGNOSIS — Z0001 Encounter for general adult medical examination with abnormal findings: Secondary | ICD-10-CM

## 2020-09-24 DIAGNOSIS — Z0000 Encounter for general adult medical examination without abnormal findings: Secondary | ICD-10-CM | POA: Diagnosis not present

## 2020-09-24 NOTE — Patient Instructions (Signed)
Health Maintenance, Female Adopting a healthy lifestyle and getting preventive care are important in promoting health and wellness. Ask your health care provider about:  The right schedule for you to have regular tests and exams.  Things you can do on your own to prevent diseases and keep yourself healthy. What should I know about diet, weight, and exercise? Eat a healthy diet  Eat a diet that includes plenty of vegetables, fruits, low-fat dairy products, and lean protein.  Do not eat a lot of foods that are high in solid fats, added sugars, or sodium.   Maintain a healthy weight Body mass index (BMI) is used to identify weight problems. It estimates body fat based on height and weight. Your health care provider can help determine your BMI and help you achieve or maintain a healthy weight. Get regular exercise Get regular exercise. This is one of the most important things you can do for your health. Most adults should:  Exercise for at least 150 minutes each week. The exercise should increase your heart rate and make you sweat (moderate-intensity exercise).  Do strengthening exercises at least twice a week. This is in addition to the moderate-intensity exercise.  Spend less time sitting. Even light physical activity can be beneficial. Watch cholesterol and blood lipids Have your blood tested for lipids and cholesterol at 58 years of age, then have this test every 5 years. Have your cholesterol levels checked more often if:  Your lipid or cholesterol levels are high.  You are older than 58 years of age.  You are at high risk for heart disease. What should I know about cancer screening? Depending on your health history and family history, you may need to have cancer screening at various ages. This may include screening for:  Breast cancer.  Cervical cancer.  Colorectal cancer.  Skin cancer.  Lung cancer. What should I know about heart disease, diabetes, and high blood  pressure? Blood pressure and heart disease  High blood pressure causes heart disease and increases the risk of stroke. This is more likely to develop in people who have high blood pressure readings, are of African descent, or are overweight.  Have your blood pressure checked: ? Every 3-5 years if you are 18-39 years of age. ? Every year if you are 40 years old or older. Diabetes Have regular diabetes screenings. This checks your fasting blood sugar level. Have the screening done:  Once every three years after age 40 if you are at a normal weight and have a low risk for diabetes.  More often and at a younger age if you are overweight or have a high risk for diabetes. What should I know about preventing infection? Hepatitis B If you have a higher risk for hepatitis B, you should be screened for this virus. Talk with your health care provider to find out if you are at risk for hepatitis B infection. Hepatitis C Testing is recommended for:  Everyone born from 1945 through 1965.  Anyone with known risk factors for hepatitis C. Sexually transmitted infections (STIs)  Get screened for STIs, including gonorrhea and chlamydia, if: ? You are sexually active and are younger than 58 years of age. ? You are older than 58 years of age and your health care provider tells you that you are at risk for this type of infection. ? Your sexual activity has changed since you were last screened, and you are at increased risk for chlamydia or gonorrhea. Ask your health care provider   if you are at risk.  Ask your health care provider about whether you are at high risk for HIV. Your health care provider may recommend a prescription medicine to help prevent HIV infection. If you choose to take medicine to prevent HIV, you should first get tested for HIV. You should then be tested every 3 months for as long as you are taking the medicine. Pregnancy  If you are about to stop having your period (premenopausal) and  you may become pregnant, seek counseling before you get pregnant.  Take 400 to 800 micrograms (mcg) of folic acid every day if you become pregnant.  Ask for birth control (contraception) if you want to prevent pregnancy. Osteoporosis and menopause Osteoporosis is a disease in which the bones lose minerals and strength with aging. This can result in bone fractures. If you are 65 years old or older, or if you are at risk for osteoporosis and fractures, ask your health care provider if you should:  Be screened for bone loss.  Take a calcium or vitamin D supplement to lower your risk of fractures.  Be given hormone replacement therapy (HRT) to treat symptoms of menopause. Follow these instructions at home: Lifestyle  Do not use any products that contain nicotine or tobacco, such as cigarettes, e-cigarettes, and chewing tobacco. If you need help quitting, ask your health care provider.  Do not use street drugs.  Do not share needles.  Ask your health care provider for help if you need support or information about quitting drugs. Alcohol use  Do not drink alcohol if: ? Your health care provider tells you not to drink. ? You are pregnant, may be pregnant, or are planning to become pregnant.  If you drink alcohol: ? Limit how much you use to 0-1 drink a day. ? Limit intake if you are breastfeeding.  Be aware of how much alcohol is in your drink. In the [place] one drink equals one 12 oz bottle of beer (355 mL), one 5 oz glass of wine (148 mL), or one 1 oz glass of hard liquor (44 mL). General instructions  Schedule regular health, dental, and eye exams.  Stay current with your vaccines.  Tell your health care provider if: ? You often feel depressed. ? You have ever been abused or do not feel safe at home. Summary  Adopting a healthy lifestyle and getting preventive care are important in promoting health and wellness.  Follow your health care provider's instructions about healthy  diet, exercising, and getting tested or screened for diseases.  Follow your health care provider's instructions on monitoring your cholesterol and blood pressure. This information is not intended to replace advice given to you by your health care provider. Make sure you discuss any questions you have with your health care provider. Document Revised: 04/25/2018 Document Reviewed: 04/25/2018 Elsevier Patient Education  2021 Elsevier Inc.  
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
## 2020-09-25 ENCOUNTER — Telehealth: Payer: Self-pay

## 2020-09-25 LAB — LIPID PANEL
Chol/HDL Ratio: 2.7 ratio (ref 0.0–4.4)
Cholesterol, Total: 181 mg/dL (ref 100–199)
HDL: 66 mg/dL (ref >39)
LDL Chol Calc (NIH): 101 mg/dL — ABNORMAL HIGH (ref 0–99)
Triglycerides: 75 mg/dL (ref 0–149)
VLDL Cholesterol Cal: 14 mg/dL (ref 5–40)

## 2020-09-25 LAB — CBC WITH DIFFERENTIAL/PLATELET
Basophils Absolute: 0.1 10*3/uL (ref 0.0–0.2)
Basos: 1 %
EOS (ABSOLUTE): 0.1 10*3/uL (ref 0.0–0.4)
Eos: 2 %
Hematocrit: 40.7 % (ref 34.0–46.6)
Hemoglobin: 14.2 g/dL (ref 11.1–15.9)
Immature Grans (Abs): 0 10*3/uL (ref 0.0–0.1)
Immature Granulocytes: 0 %
Lymphocytes Absolute: 1.7 10*3/uL (ref 0.7–3.1)
Lymphs: 29 %
MCH: 32.7 pg (ref 26.6–33.0)
MCHC: 34.9 g/dL (ref 31.5–35.7)
MCV: 94 fL (ref 79–97)
Monocytes Absolute: 0.5 10*3/uL (ref 0.1–0.9)
Monocytes: 9 %
Neutrophils Absolute: 3.5 10*3/uL (ref 1.4–7.0)
Neutrophils: 59 %
Platelets: 209 10*3/uL (ref 150–450)
RBC: 4.34 x10E6/uL (ref 3.77–5.28)
RDW: 11.7 % (ref 11.7–15.4)
WBC: 5.9 10*3/uL (ref 3.4–10.8)

## 2020-09-25 LAB — CMP14+EGFR
ALT: 7 IU/L (ref 0–32)
AST: 13 IU/L (ref 0–40)
Albumin/Globulin Ratio: 1.8 (ref 1.2–2.2)
Albumin: 4.2 g/dL (ref 3.8–4.9)
Alkaline Phosphatase: 68 IU/L (ref 44–121)
BUN/Creatinine Ratio: 15 (ref 9–23)
BUN: 11 mg/dL (ref 6–24)
Bilirubin Total: 0.5 mg/dL (ref 0.0–1.2)
CO2: 24 mmol/L (ref 20–29)
Calcium: 9.2 mg/dL (ref 8.7–10.2)
Chloride: 105 mmol/L (ref 96–106)
Creatinine, Ser: 0.75 mg/dL (ref 0.57–1.00)
Globulin, Total: 2.3 g/dL (ref 1.5–4.5)
Glucose: 81 mg/dL (ref 65–99)
Potassium: 4.4 mmol/L (ref 3.5–5.2)
Sodium: 142 mmol/L (ref 134–144)
Total Protein: 6.5 g/dL (ref 6.0–8.5)
eGFR: 92 mL/min/{1.73_m2} (ref >59)

## 2020-09-25 LAB — VITAMIN D 25 HYDROXY (VIT D DEFICIENCY, FRACTURES): Vit D, 25-Hydroxy: 31.1 ng/mL (ref 30.0–100.0)

## 2020-09-25 LAB — TSH: TSH: 1.13 u[IU]/mL (ref 0.450–4.500)

## 2020-09-25 LAB — VITAMIN B12: Vitamin B-12: 370 pg/mL (ref 232–1245)

## 2020-09-25 NOTE — Telephone Encounter (Signed)
I spoke with pt and she is concerned that something is wrong with her BP since she has had 2 readings in the past 4 months that systolic was 148 and diastolic 95. Her reading in the office was 90's over 60's and her reading last night with her home BP cuff was 111/74. Advised pt our BP can change with pain, stress, what we eat or drink and for her to start checking her BP twice daily at the same time everyday in the same position and record them. Advised to call with consistant readings of systolic over 140 or diastolic readings over 90 and to bring her log in with her at her next appt and pt voiced understanding.

## 2020-09-28 ENCOUNTER — Telehealth: Payer: Self-pay

## 2020-09-29 DIAGNOSIS — S42295D Other nondisplaced fracture of upper end of left humerus, subsequent encounter for fracture with routine healing: Secondary | ICD-10-CM | POA: Diagnosis not present

## 2020-09-30 DIAGNOSIS — S42295D Other nondisplaced fracture of upper end of left humerus, subsequent encounter for fracture with routine healing: Secondary | ICD-10-CM | POA: Diagnosis not present

## 2022-03-02 IMAGING — CR DG SHOULDER 1V*L*
1 series · 1 of 1 positions shown · non-contrast
Comparison: Same-day intraoperative fluoroscopic images of the
proximal left humerus 06/10/2020.

CLINICAL DATA: Postop check. Additional provided: Status post ORIF
left proximal humerus.

EXAM:
LEFT SHOULDER

[shoulder ap]
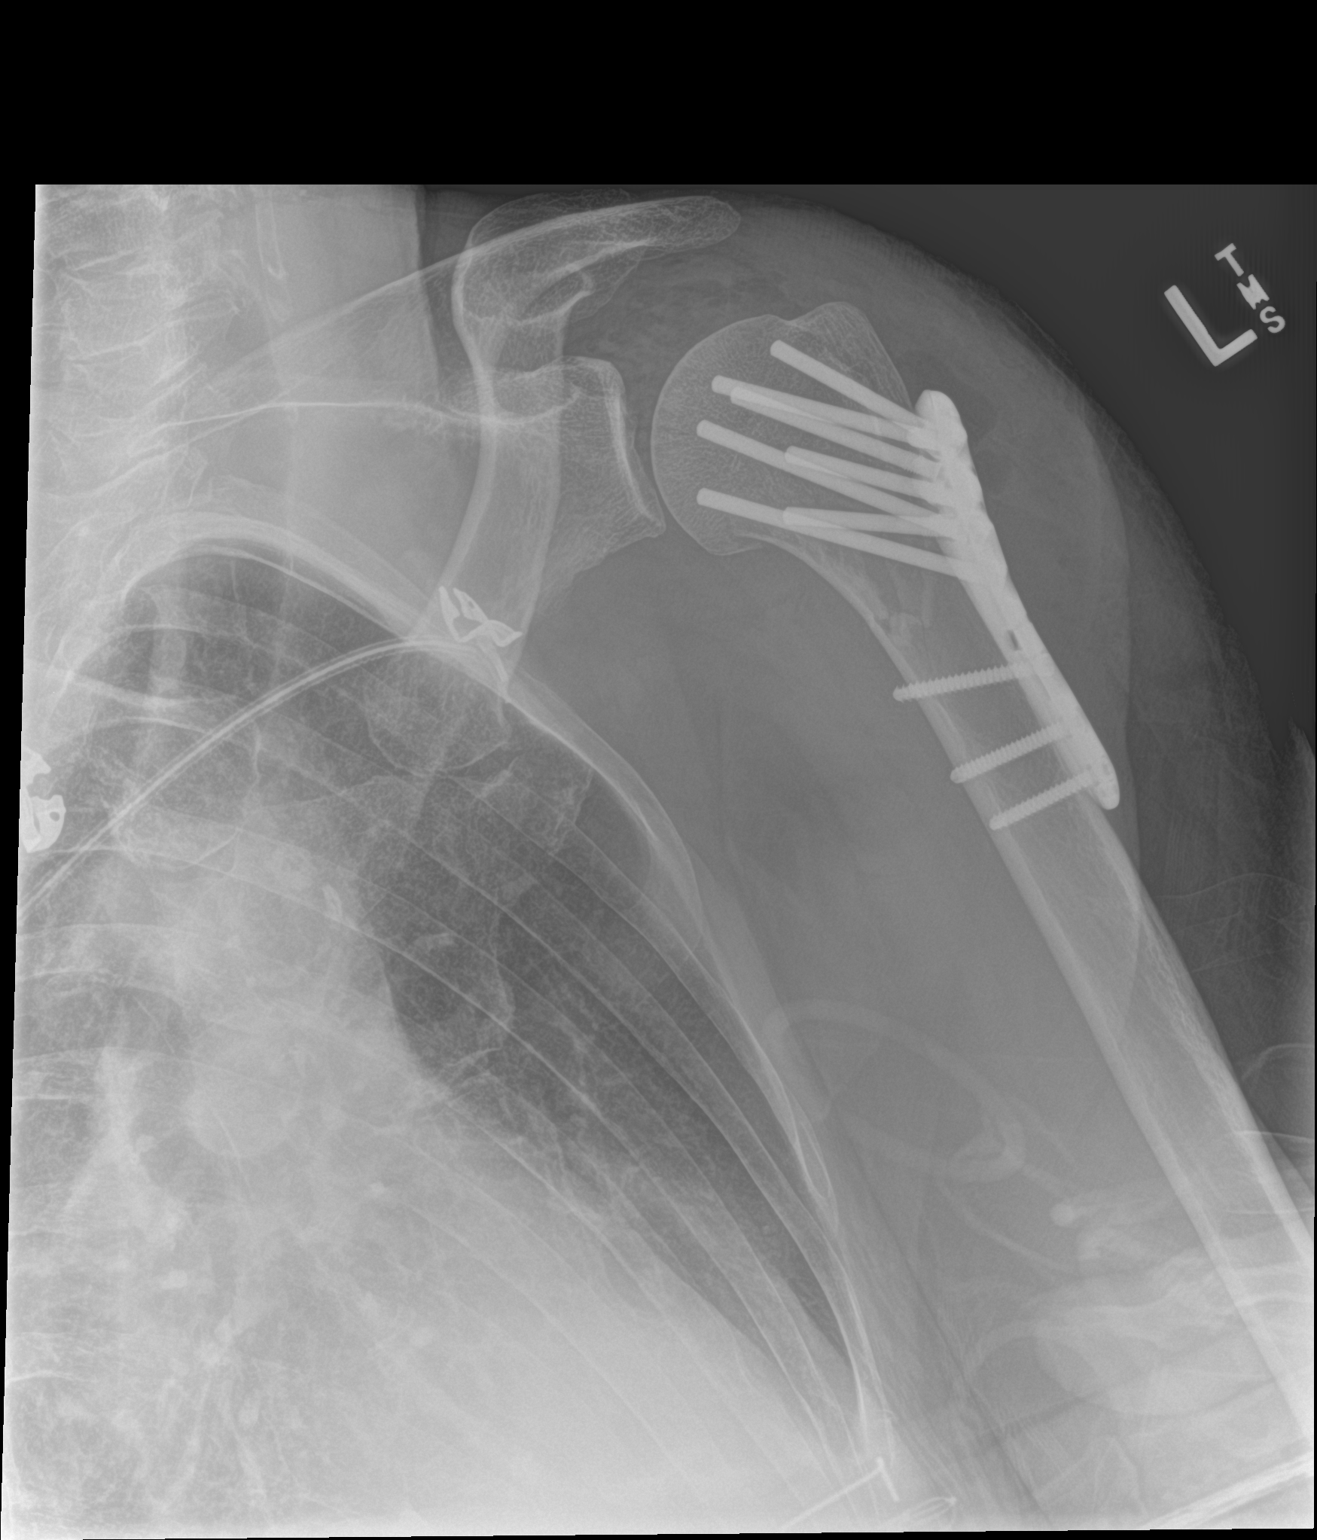

[1 of 1 positions shown; findings below may reference images not displayed]

FINDINGS: A single Grashey view radiograph of the proximal left humerus is
submitted. There has been lateral plate and screw fixation of a
proximal left humeral fracture. Alignment appears essentially
anatomic on this single view radiograph.
IMPRESSION: Prior lateral plate and screw fixation of a proximal left humeral
fracture. Alignment appears essentially anatomic on this single view
radiograph.

## 2022-03-02 IMAGING — RF DG SHOULDER 2+V*L*
1 series · 2 of 2 positions shown · non-contrast
Comparison: No pertinent prior exams available for comparison.

CLINICAL DATA: ORIF left proximal humerus. Provided fluoroscopy
time: 25 seconds (1.61 mGy).

EXAM:
DG C-ARM 1-60 MIN; LEFT SHOULDER - 2+ VIEW

[Series 1: run · 2 of 2 slices shown]
[im 1/2]
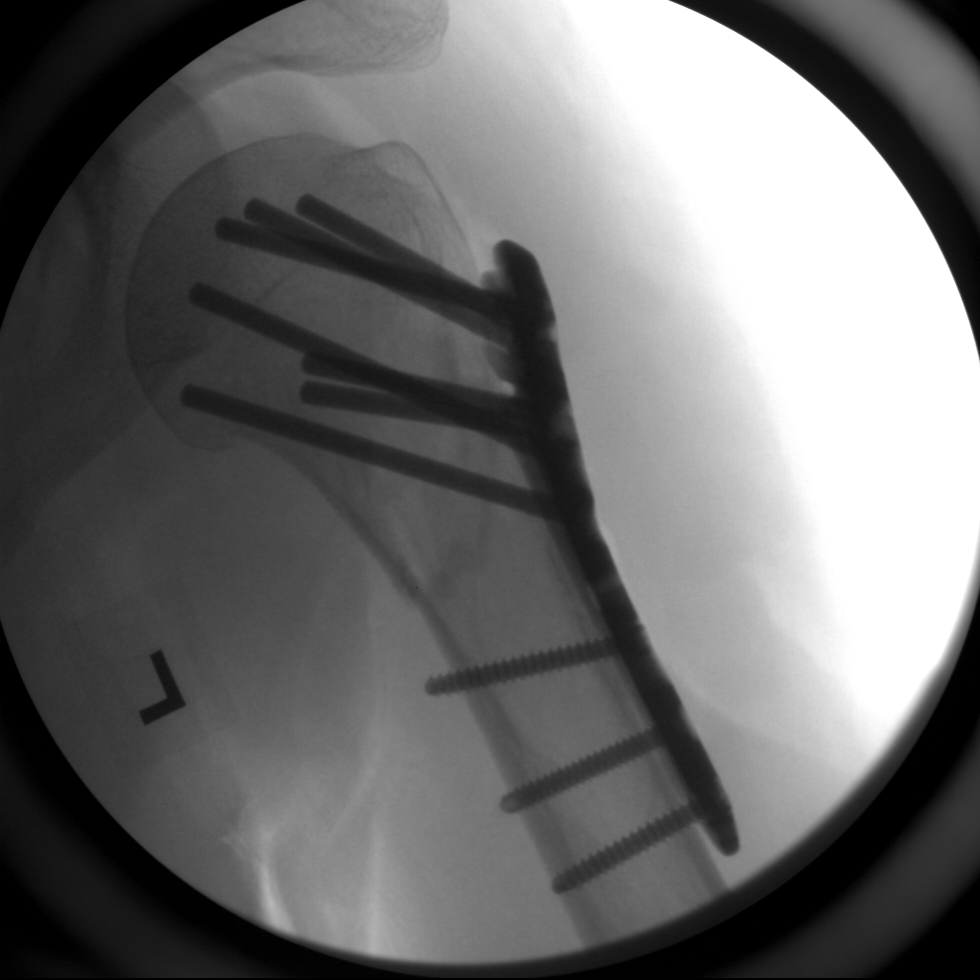
[im 2/2]
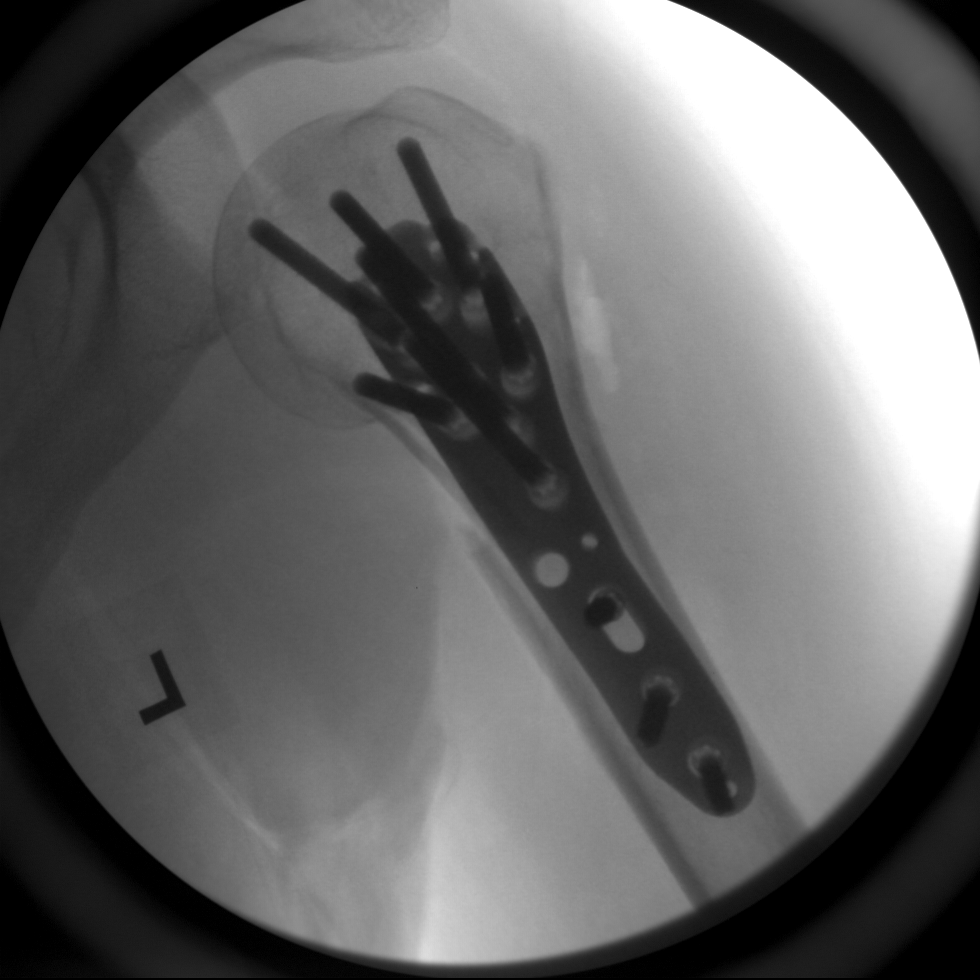

[2 of 2 positions shown; findings below may reference images not displayed]

FINDINGS: Two intraoperative fluoroscopic images of the proximal left humerus
are submitted. There has been lateral plate and screw fixation of a
proximal left humeral fracture. No unexpected finding.
IMPRESSION: Two intraoperative fluoroscopic images from ORIF of a proximal left
humeral fracture, as described.

## 2022-03-02 IMAGING — RF DG C-ARM 1-60 MIN
1 series · 2 of 2 positions shown · non-contrast
Comparison: No pertinent prior exams available for comparison.

CLINICAL DATA: ORIF left proximal humerus. Provided fluoroscopy
time: 25 seconds (1.61 mGy).

EXAM:
DG C-ARM 1-60 MIN; LEFT SHOULDER - 2+ VIEW

[Series 1: run · 2 of 2 slices shown]
[im 1/2]
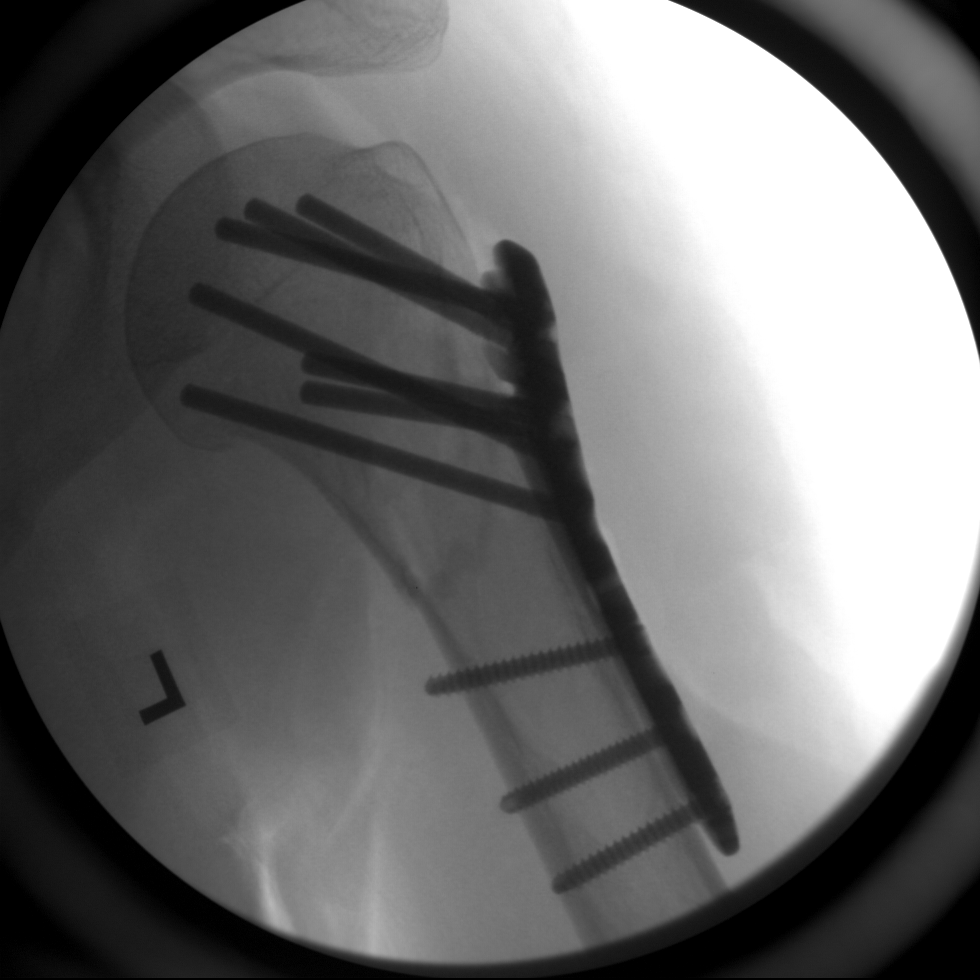
[im 2/2]
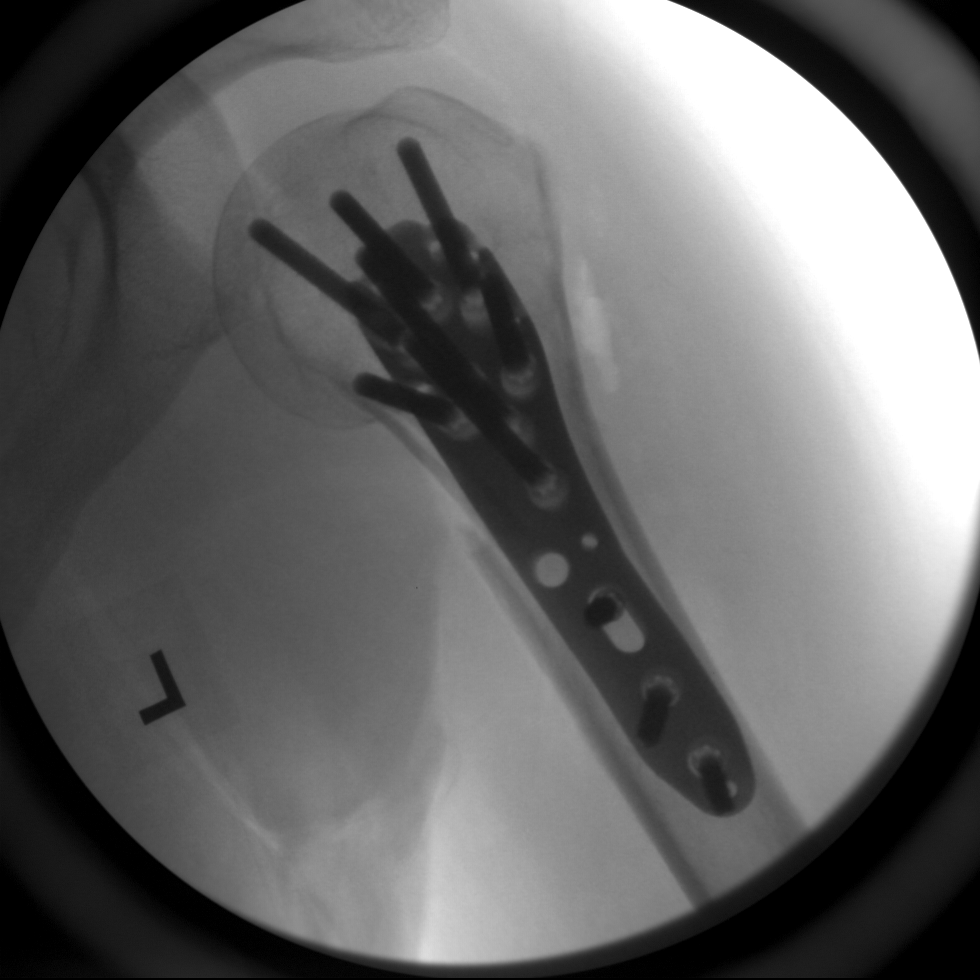

[2 of 2 positions shown; findings below may reference images not displayed]

FINDINGS: Two intraoperative fluoroscopic images of the proximal left humerus
are submitted. There has been lateral plate and screw fixation of a
proximal left humeral fracture. No unexpected finding.
IMPRESSION: Two intraoperative fluoroscopic images from ORIF of a proximal left
humeral fracture, as described.

## 2022-08-31 ENCOUNTER — Encounter: Payer: Self-pay | Admitting: Family Medicine

## 2023-02-08 ENCOUNTER — Encounter: Payer: Self-pay | Admitting: Family Medicine

## 2023-06-15 DIAGNOSIS — Z20822 Contact with and (suspected) exposure to covid-19: Secondary | ICD-10-CM | POA: Diagnosis not present

## 2023-06-15 DIAGNOSIS — R059 Cough, unspecified: Secondary | ICD-10-CM | POA: Diagnosis not present

## 2023-06-19 DIAGNOSIS — J209 Acute bronchitis, unspecified: Secondary | ICD-10-CM | POA: Diagnosis not present

## 2023-06-19 DIAGNOSIS — J029 Acute pharyngitis, unspecified: Secondary | ICD-10-CM | POA: Diagnosis not present

## 2023-06-19 DIAGNOSIS — R059 Cough, unspecified: Secondary | ICD-10-CM | POA: Diagnosis not present

## 2023-06-29 DIAGNOSIS — Z7289 Other problems related to lifestyle: Secondary | ICD-10-CM | POA: Diagnosis not present

## 2023-06-29 DIAGNOSIS — Z1159 Encounter for screening for other viral diseases: Secondary | ICD-10-CM | POA: Diagnosis not present

## 2023-06-29 DIAGNOSIS — Z79899 Other long term (current) drug therapy: Secondary | ICD-10-CM | POA: Diagnosis not present

## 2023-06-29 DIAGNOSIS — Z0000 Encounter for general adult medical examination without abnormal findings: Secondary | ICD-10-CM | POA: Diagnosis not present

## 2023-06-29 DIAGNOSIS — Z131 Encounter for screening for diabetes mellitus: Secondary | ICD-10-CM | POA: Diagnosis not present

## 2023-08-10 DIAGNOSIS — B351 Tinea unguium: Secondary | ICD-10-CM | POA: Diagnosis not present

## 2023-08-10 DIAGNOSIS — D2372 Other benign neoplasm of skin of left lower limb, including hip: Secondary | ICD-10-CM | POA: Diagnosis not present

## 2023-08-10 DIAGNOSIS — M79676 Pain in unspecified toe(s): Secondary | ICD-10-CM | POA: Diagnosis not present

## 2023-08-10 DIAGNOSIS — M79672 Pain in left foot: Secondary | ICD-10-CM | POA: Diagnosis not present

## 2024-02-20 ENCOUNTER — Ambulatory Visit: Payer: Self-pay | Admitting: Nurse Practitioner
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
